# Patient Record
Sex: Male | Born: 1950 | Race: White | Hispanic: No | Marital: Married | Smoking: Never smoker
Health system: Southern US, Community
[De-identification: ages and names within clinical notes are randomized; demographics above are authoritative.]

## PROBLEM LIST (undated history)

## (undated) DIAGNOSIS — R519 Headache, unspecified: Secondary | ICD-10-CM

## (undated) DIAGNOSIS — R51 Headache: Secondary | ICD-10-CM

## (undated) DIAGNOSIS — G4730 Sleep apnea, unspecified: Secondary | ICD-10-CM

## (undated) DIAGNOSIS — E781 Pure hyperglyceridemia: Secondary | ICD-10-CM

## (undated) DIAGNOSIS — Z87442 Personal history of urinary calculi: Secondary | ICD-10-CM

## (undated) DIAGNOSIS — M1990 Unspecified osteoarthritis, unspecified site: Secondary | ICD-10-CM

## (undated) HISTORY — PX: JOINT REPLACEMENT: SHX530

## (undated) HISTORY — PX: HERNIA REPAIR: SHX51

## (undated) HISTORY — PX: OTHER SURGICAL HISTORY: SHX169

---

## 2017-11-28 ENCOUNTER — Ambulatory Visit: Payer: Self-pay | Admitting: Orthopedic Surgery

## 2017-11-28 NOTE — H&P (Signed)
Dalton Reynolds DOB: 09-04-1951 Married / Language: English / Race: White Male Date of Admission:  12/17/2017 CC:  Right hip pain History of Present Illness The patient is a 66 year old male who comes in for a preoperative History and Physical. The patient is scheduled for a right total hip arthroplasty (anterior) to be performed by Dr. [name], MD at [facility] on 12-17-2017. The patient is a 66 year old male who presented for follow up of their hip. The patient is being followed for their right hip pain and osteoarthritis. They are months out from intra-articular injection. Symptoms reported include: pain, pain with weightbearing and difficulty ambulating. The patient feels that they are doing poorly and report their pain level to be moderate to severe. The following medication has been used for pain control: none. He stated the last injection worked great for about two weeks, but he had severe pain and difficulty bearing weight after the shot wore off. He feels that the RIGHT hip has gotten a lot worse over time. The injections are not doing as well as they once did. He is a little better post injection but is not a tremendous improvement. The hip hurts with most activities. He is even having pain at rest. He has lost a lot of motion in the hip. He is unable to do things he desires because of the hip pain. LEFT hip is currently not bothering him. He is ready to get the hip fixed. They have been treated conservatively in the past for the above stated problem and despite conservative measures, they continue to have progressive pain and severe functional limitations and dysfunction. They have failed non-operative management including home exercise, medications, and injections. It is felt that they would benefit from undergoing total joint replacement. Risks and benefits of the procedure have been discussed with the patient and they elect to proceed with surgery. There are no active  contraindications to surgery such as ongoing infection or rapidly progressive neurological disease.   Problem List/Past Medical Primary osteoarthritis of right hip (M16.11)  Hypercholesterolemia  Sleep Apnea  BiPAP Roseca    Allergies  No Known Drug Allergies  Family History Chronic Obstructive Lung Disease  Brother, Mother. Diabetes Mellitus  Brother, Father.  Social History Children  3 Current work status  working full time Living situation  live with spouse Marital status  married Never consumed alcohol  03/27/2016: Never consumed alcohol No history of drug/alcohol rehab  Not under pain contract  Number of flights of stairs before winded  2-3 Tobacco / smoke exposure  03/27/2016: no Tobacco use  Never smoker. 03/27/2016 Post-Surgical Plans  Home With Family, Home with HHPT.  Medication History Sulfamethoxazole-Trimethoprim (Oral) Specific strength unknown - Active. Aspirin (81MG  Tablet, 1/2 Oral daily) Active. Fenofibrate (160MG  Tablet, Oral) Active. Omega 3 (Oral) Specific strength unknown - Active. Vitamin D (Oral) Specific strength unknown - Active.  Past Surgical History Arthroscopy of Knee  left Inguinal Hernia Repair  open: bilateral Colonoscopy   Review of Systems  General Not Present- Chills, Fatigue, Fever, Memory Loss, Night Sweats, Weight Gain and Weight Loss. Skin Not Present- Eczema, Hives, Itching, Lesions and Rash. HEENT Not Present- Dentures, Double Vision, Headache, Hearing Loss, Tinnitus and Visual Loss. Respiratory Not Present- Allergies, Chronic Cough, Coughing up blood, Shortness of breath at rest and Shortness of breath with exertion. Note: recent sore throat but resolved Cardiovascular Not Present- Chest Pain, Difficulty Breathing Lying Down, Murmur, Palpitations, Racing/skipping heartbeats and Swelling. Gastrointestinal Not Present-  Abdominal Pain, Bloody Stool, Constipation, Diarrhea, Difficulty Swallowing,  Heartburn, Jaundice, Loss of appetitie, Nausea and Vomiting. Male Genitourinary Not Present- Blood in Urine, Discharge, Flank Pain, Incontinence, Painful Urination, Urgency, Urinary frequency, Urinary Retention, Urinating at Night and Weak urinary stream. Musculoskeletal Present- Joint Pain. Not Present- Back Pain, Joint Swelling, Morning Stiffness, Muscle Pain, Muscle Weakness and Spasms. Neurological Not Present- Blackout spells, Difficulty with balance, Dizziness, Paralysis, Tremor and Weakness. Psychiatric Not Present- Insomnia.  Vitals Weight: 265 lb Height: 67.5in Weight was reported by patient. Height was reported by patient. Body Surface Area: 2.29 m Body Mass Index: 40.89 kg/m  Pulse: 76 (Regular)  BP: 132/88 (Sitting, Right Arm, Standard)   Physical Exam General Mental Status -Alert, cooperative and good historian. General Appearance-pleasant, Not in acute distress. Orientation-Oriented X3. Build & Nutrition-Well nourished and Well developed.  Head and Neck Head-normocephalic, atraumatic . Neck Global Assessment - supple, no bruit auscultated on the right, no bruit auscultated on the left.  Eye Pupil - Bilateral-Regular and Round. Motion - Bilateral-EOMI.  Chest and Lung Exam Auscultation Breath sounds - clear at anterior chest wall and clear at posterior chest wall. Adventitious sounds - No Adventitious sounds.  Cardiovascular Auscultation Rhythm - Regular rate and rhythm. Heart Sounds - S1 WNL and S2 WNL. Murmurs & Other Heart Sounds - Auscultation of the heart reveals - No Murmurs.  Abdomen Inspection Contour - Generalized mild distention. Palpation/Percussion Tenderness - Abdomen is non-tender to palpation. Rigidity (guarding) - Abdomen is soft. Auscultation Auscultation of the abdomen reveals - Bowel sounds normal.  Male Genitourinary Note: Not done, not pertinent to present illness   Musculoskeletal Note: Evaluation of the  left hip shows flexion to 120 rotation in 30 out 40 and abduction 40 without discomfort. There is no tenderness over the greater trochanter. There is no pain on provocative testing of the hip.. His RIGHT hip can be flexed to 100 and no internal rotation 25 of external rotation and 20 of abduction. He has a significantly antalgic gait pattern on the RIGHT. Radiographs AP pelvis and lateral of the RIGHT hip show bone-on-bone arthritis of that hip with subchondral cystic formation.  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 Warren Gallemore  IV TXA  Anesthesia Issues: None  Patient was instructed on what medications to stop prior to surgery.  Signed electronically by Alexzandrew L Perkins, III PA-C  

## 2017-11-28 NOTE — H&P (View-Only) (Signed)
Dalton Reynolds DOB: 09-04-1951 Married / Language: English / Race: White Male Date of Admission:  12/17/2017 CC:  Right hip pain History of Present Illness The patient is a 66 year old male who comes in for a preoperative History and Physical. The patient is scheduled for a right total hip arthroplasty (anterior) to be performed by Dr. [name], MD at [facility] on 12-17-2017. The patient is a 66 year old male who presented for follow up of their hip. The patient is being followed for their right hip pain and osteoarthritis. They are months out from intra-articular injection. Symptoms reported include: pain, pain with weightbearing and difficulty ambulating. The patient feels that they are doing poorly and report their pain level to be moderate to severe. The following medication has been used for pain control: none. He stated the last injection worked great for about two weeks, but he had severe pain and difficulty bearing weight after the shot wore off. He feels that the RIGHT hip has gotten a lot worse over time. The injections are not doing as well as they once did. He is a little better post injection but is not a tremendous improvement. The hip hurts with most activities. He is even having pain at rest. He has lost a lot of motion in the hip. He is unable to do things he desires because of the hip pain. LEFT hip is currently not bothering him. He is ready to get the hip fixed. They have been treated conservatively in the past for the above stated problem and despite conservative measures, they continue to have progressive pain and severe functional limitations and dysfunction. They have failed non-operative management including home exercise, medications, and injections. It is felt that they would benefit from undergoing total joint replacement. Risks and benefits of the procedure have been discussed with the patient and they elect to proceed with surgery. There are no active  contraindications to surgery such as ongoing infection or rapidly progressive neurological disease.   Problem List/Past Medical Primary osteoarthritis of right hip (M16.11)  Hypercholesterolemia  Sleep Apnea  BiPAP Roseca    Allergies  No Known Drug Allergies  Family History Chronic Obstructive Lung Disease  Brother, Mother. Diabetes Mellitus  Brother, Father.  Social History Children  3 Current work status  working full time Living situation  live with spouse Marital status  married Never consumed alcohol  03/27/2016: Never consumed alcohol No history of drug/alcohol rehab  Not under pain contract  Number of flights of stairs before winded  2-3 Tobacco / smoke exposure  03/27/2016: no Tobacco use  Never smoker. 03/27/2016 Post-Surgical Plans  Home With Family, Home with HHPT.  Medication History Sulfamethoxazole-Trimethoprim (Oral) Specific strength unknown - Active. Aspirin (81MG  Tablet, 1/2 Oral daily) Active. Fenofibrate (160MG  Tablet, Oral) Active. Omega 3 (Oral) Specific strength unknown - Active. Vitamin D (Oral) Specific strength unknown - Active.  Past Surgical History Arthroscopy of Knee  left Inguinal Hernia Repair  open: bilateral Colonoscopy   Review of Systems  General Not Present- Chills, Fatigue, Fever, Memory Loss, Night Sweats, Weight Gain and Weight Loss. Skin Not Present- Eczema, Hives, Itching, Lesions and Rash. HEENT Not Present- Dentures, Double Vision, Headache, Hearing Loss, Tinnitus and Visual Loss. Respiratory Not Present- Allergies, Chronic Cough, Coughing up blood, Shortness of breath at rest and Shortness of breath with exertion. Note: recent sore throat but resolved Cardiovascular Not Present- Chest Pain, Difficulty Breathing Lying Down, Murmur, Palpitations, Racing/skipping heartbeats and Swelling. Gastrointestinal Not Present-  Abdominal Pain, Bloody Stool, Constipation, Diarrhea, Difficulty Swallowing,  Heartburn, Jaundice, Loss of appetitie, Nausea and Vomiting. Male Genitourinary Not Present- Blood in Urine, Discharge, Flank Pain, Incontinence, Painful Urination, Urgency, Urinary frequency, Urinary Retention, Urinating at Night and Weak urinary stream. Musculoskeletal Present- Joint Pain. Not Present- Back Pain, Joint Swelling, Morning Stiffness, Muscle Pain, Muscle Weakness and Spasms. Neurological Not Present- Blackout spells, Difficulty with balance, Dizziness, Paralysis, Tremor and Weakness. Psychiatric Not Present- Insomnia.  Vitals Weight: 265 lb Height: 67.5in Weight was reported by patient. Height was reported by patient. Body Surface Area: 2.29 m Body Mass Index: 40.89 kg/m  Pulse: 76 (Regular)  BP: 132/88 (Sitting, Right Arm, Standard)   Physical Exam General Mental Status -Alert, cooperative and good historian. General Appearance-pleasant, Not in acute distress. Orientation-Oriented X3. Build & Nutrition-Well nourished and Well developed.  Head and Neck Head-normocephalic, atraumatic . Neck Global Assessment - supple, no bruit auscultated on the right, no bruit auscultated on the left.  Eye Pupil - Bilateral-Regular and Round. Motion - Bilateral-EOMI.  Chest and Lung Exam Auscultation Breath sounds - clear at anterior chest wall and clear at posterior chest wall. Adventitious sounds - No Adventitious sounds.  Cardiovascular Auscultation Rhythm - Regular rate and rhythm. Heart Sounds - S1 WNL and S2 WNL. Murmurs & Other Heart Sounds - Auscultation of the heart reveals - No Murmurs.  Abdomen Inspection Contour - Generalized mild distention. Palpation/Percussion Tenderness - Abdomen is non-tender to palpation. Rigidity (guarding) - Abdomen is soft. Auscultation Auscultation of the abdomen reveals - Bowel sounds normal.  Male Genitourinary Note: Not done, not pertinent to present illness   Musculoskeletal Note: Evaluation of the  left hip shows flexion to 120 rotation in 30 out 40 and abduction 40 without discomfort. There is no tenderness over the greater trochanter. There is no pain on provocative testing of the hip.Marland Kitchen. His RIGHT hip can be flexed to 100 and no internal rotation 25 of external rotation and 20 of abduction. He has a significantly antalgic gait pattern on the RIGHT. Radiographs AP pelvis and lateral of the RIGHT hip show bone-on-bone arthritis of that hip with subchondral cystic formation.  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 Brooke BonitoWarren Gallemore  IV TXA  Anesthesia Issues: None  Patient was instructed on what medications to stop prior to surgery.  Signed electronically by Lauraine RinneAlexzandrew L Latiya Navia, III PA-C

## 2017-12-11 NOTE — Patient Instructions (Signed)
Dorothe PeaDennis Winokur  12/11/2017   Your procedure is scheduled on: 12-17-17  [facility]   Report to admitting at     0745 AM   Call this number if you have problems the morning of surgery  [phone]   Remember: ONLY 1 PERSON MAY GO WITH YOU TO SHORT STAY TO GET  READY MORNING OF YOUR SURGERY.  Do not eat food or drink liquids :After Midnight.     Take these medicines the morning of surgery with A SIP OF WATER:  DO NOT TAKE ANY DIABETIC MEDICATIONS DAY OF YOUR SURGERY                               You may not have any metal on your body including hair pins and              piercings  Do not wear jewelry,  lotions, powders or perfumes, deodorant                       Men may shave face and neck.   Do not bring valuables to the hospital. [facility] IS NOT             RESPONSIBLE   FOR VALUABLES.  Contacts, dentures or bridgework may not be worn into surgery.  Leave suitcase in the car. After surgery it may be brought to your room.                Please read over the following fact sheets you were given: _____________________________________________________________________          [facility] - Preparing for Surgery Before surgery, you can play an important role.  Because skin is not sterile, your skin needs to be as free of germs as possible.  You can reduce the number of germs on your skin by washing with CHG (chlorahexidine gluconate) soap before surgery.  CHG is an antiseptic cleaner which kills germs and bonds with the skin to continue killing germs even after washing. Please DO NOT use if you have an allergy to CHG or antibacterial soaps.  If your skin becomes reddened/irritated stop using the CHG and inform your nurse when you arrive at Short Stay. Do not shave (including legs and underarms) for at least 48 hours prior to the first CHG shower.  You may shave your face/neck. Please follow these instructions carefully:  1.  Shower  with CHG Soap the night before surgery and the  morning of Surgery.  2.  If you choose to wash your hair, wash your hair first as usual with your  normal  shampoo.  3.  After you shampoo, rinse your hair and body thoroughly to remove the  shampoo.                           4.  Use CHG as you would any other liquid soap.  You can apply chg directly  to the skin and wash                       Gently with a scrungie or clean washcloth.  5.  Apply the CHG Soap to your body ONLY FROM THE NECK DOWN.   Do not use on face/  open                           Wound or open sores. Avoid contact with eyes, ears mouth and genitals (private parts).                       Wash face,  Genitals (private parts) with your normal soap.             6.  Wash thoroughly, paying special attention to the area where your surgery  will be performed.  7.  Thoroughly rinse your body with warm water from the neck down.  8.  DO NOT shower/wash with your normal soap after using and rinsing off  the CHG Soap.                9.  Pat yourself dry with a clean towel.            10.  Wear clean pajamas.            11.  Place clean sheets on your bed the night of your first shower and do not  sleep with pets. Day of Surgery : Do not apply any lotions/deodorants the morning of surgery.  Please wear clean clothes to the hospital/surgery center.  FAILURE TO FOLLOW THESE INSTRUCTIONS MAY RESULT IN THE CANCELLATION OF YOUR SURGERY PATIENT SIGNATURE_________________________________  NURSE SIGNATURE__________________________________  ________________________________________________________________________  WHAT IS A BLOOD TRANSFUSION? Blood Transfusion Information  A transfusion is the replacement of blood or some of its parts. Blood is made up of multiple cells which provide different functions.  Red blood cells carry oxygen and are used for blood loss replacement.  White blood cells fight against infection.  Platelets control  bleeding.  Plasma helps clot blood.  Other blood products are available for specialized needs, such as hemophilia or other clotting disorders. BEFORE THE TRANSFUSION  Who gives blood for transfusions?   Healthy volunteers who are fully evaluated to make sure their blood is safe. This is blood bank blood. Transfusion therapy is the safest it has ever been in the practice of medicine. Before blood is taken from a donor, a complete history is taken to make sure that person has no history of diseases nor engages in risky social behavior (examples are intravenous drug use or sexual activity with multiple partners). The donor's travel history is screened to minimize risk of transmitting infections, such as malaria. The donated blood is tested for signs of infectious diseases, such as HIV and hepatitis. The blood is then tested to be sure it is compatible with you in order to minimize the chance of a transfusion reaction. If you or a relative donates blood, this is often done in anticipation of surgery and is not appropriate for emergency situations. It takes many days to process the donated blood. RISKS AND COMPLICATIONS Although transfusion therapy is very safe and saves many lives, the main dangers of transfusion include:   Getting an infectious disease.  Developing a transfusion reaction. This is an allergic reaction to something in the blood you were given. Every precaution is taken to prevent this. The decision to have a blood transfusion has been considered carefully by your caregiver before blood is given. Blood is not given unless the benefits outweigh the risks. AFTER THE TRANSFUSION  Right after receiving a blood transfusion, you will usually feel much better and more energetic. This is especially true if your red blood  cells have gotten low (anemic). The transfusion raises the level of the red blood cells which carry oxygen, and this usually causes an energy increase.  The nurse  administering the transfusion will monitor you carefully for complications. HOME CARE INSTRUCTIONS  No special instructions are needed after a transfusion. You may find your energy is better. Speak with your caregiver about any limitations on activity for underlying diseases you may have. SEEK MEDICAL CARE IF:   Your condition is not improving after your transfusion.  You develop redness or irritation at the intravenous (IV) site. SEEK IMMEDIATE MEDICAL CARE IF:  Any of the following symptoms occur over the next 12 hours:  Shaking chills.  You have a temperature by mouth above 102 F (38.9 C), not controlled by medicine.  Chest, back, or muscle pain.  People around you feel you are not acting correctly or are confused.  Shortness of breath or difficulty breathing.  Dizziness and fainting.  You get a rash or develop hives.  You have a decrease in urine output.  Your urine turns a dark color or changes to pink, red, or brown. Any of the following symptoms occur over the next 10 days:  You have a temperature by mouth above 102 F (38.9 C), not controlled by medicine.  Shortness of breath.  Weakness after normal activity.  The white part of the eye turns yellow (jaundice).  You have a decrease in the amount of urine or are urinating less often.  Your urine turns a dark color or changes to pink, red, or brown. Document Released: 12/13/2000 Document Revised: 03/09/2012 Document Reviewed: 08/01/2008 ExitCare Patient Information 2014 [place].  _______________________________________________________________________  Incentive Spirometer  An incentive spirometer is a tool that can help keep your lungs clear and active. This tool measures how well you are filling your lungs with each breath. Taking long deep breaths may help reverse or decrease the chance of developing breathing (pulmonary) problems (especially infection) following:  A long period of time when you are  unable to move or be active. BEFORE THE PROCEDURE   If the spirometer includes an indicator to show your best effort, your nurse or respiratory therapist will set it to a desired goal.  If possible, sit up straight or lean slightly forward. Try not to slouch.  Hold the incentive spirometer in an upright position. INSTRUCTIONS FOR USE  1. Sit on the edge of your bed if possible, or sit up as far as you can in bed or on a chair. 2. Hold the incentive spirometer in an upright position. 3. Breathe out normally. 4. Place the mouthpiece in your mouth and seal your lips tightly around it. 5. Breathe in slowly and as deeply as possible, raising the piston or the ball toward the top of the column. 6. Hold your breath for 3-5 seconds or for as long as possible. Allow the piston or ball to fall to the bottom of the column. 7. Remove the mouthpiece from your mouth and breathe out normally. 8. Rest for a few seconds and repeat Steps 1 through 7 at least 10 times every 1-2 hours when you are awake. Take your time and take a few normal breaths between deep breaths. 9. The spirometer may include an indicator to show your best effort. Use the indicator as a goal to work toward during each repetition. 10. After each set of 10 deep breaths, practice coughing to be sure your lungs are clear. If you have an incision (the cut made  at the time of surgery), support your incision when coughing by placing a pillow or rolled up towels firmly against it. Once you are able to get out of bed, walk around indoors and cough well. You may stop using the incentive spirometer when instructed by your caregiver.  RISKS AND COMPLICATIONS  Take your time so you do not get dizzy or light-headed.  If you are in pain, you may need to take or ask for pain medication before doing incentive spirometry. It is harder to take a deep breath if you are having pain. AFTER USE  Rest and breathe slowly and easily.  It can be helpful to  keep track of a log of your progress. Your caregiver can provide you with a simple table to help with this. If you are using the spirometer at home, follow these instructions: [place] IF:   You are having difficultly using the spirometer.  You have trouble using the spirometer as often as instructed.  Your pain medication is not giving enough relief while using the spirometer.  You develop fever of 100.5 F (38.1 C) or higher. SEEK IMMEDIATE MEDICAL CARE IF:   You cough up bloody sputum that had not been present before.  You develop fever of 102 F (38.9 C) or greater.  You develop worsening pain at or near the incision site. MAKE SURE YOU:   Understand these instructions.  Will watch your condition.  Will get help right away if you are not doing well or get worse. Document Released: 04/28/2007 Document Revised: 03/09/2012 Document Reviewed: 06/29/2007 [facility] Patient Information 2014 [place], [place].   ________________________________________________________________________

## 2017-12-12 ENCOUNTER — Encounter (HOSPITAL_COMMUNITY): Payer: Self-pay

## 2017-12-12 ENCOUNTER — Other Ambulatory Visit: Payer: Self-pay

## 2017-12-12 ENCOUNTER — Encounter (HOSPITAL_COMMUNITY)
Admission: RE | Admit: 2017-12-12 | Discharge: 2017-12-12 | Disposition: A | Discharge status: Home / Self Care | Payer: Medicare HMO | Source: Ambulatory Visit | Attending: Orthopedic Surgery | Admitting: Orthopedic Surgery

## 2017-12-12 DIAGNOSIS — Z01812 Encounter for preprocedural laboratory examination: Secondary | ICD-10-CM | POA: Insufficient documentation

## 2017-12-12 DIAGNOSIS — M169 Osteoarthritis of hip, unspecified: Secondary | ICD-10-CM | POA: Diagnosis not present

## 2017-12-12 HISTORY — DX: Headache, unspecified: R51.9

## 2017-12-12 HISTORY — DX: Headache: R51

## 2017-12-12 HISTORY — DX: Sleep apnea, unspecified: G47.30

## 2017-12-12 HISTORY — DX: Pure hyperglyceridemia: E78.1

## 2017-12-12 HISTORY — DX: Unspecified osteoarthritis, unspecified site: M19.90

## 2017-12-12 HISTORY — DX: Personal history of urinary calculi: Z87.442

## 2017-12-12 LAB — COMPREHENSIVE METABOLIC PANEL
ALBUMIN: 4.2 g/dL (ref 3.5–5.0)
ALK PHOS: 53 U/L (ref 38–126)
ALT: 21 U/L (ref 17–63)
ANION GAP: 10 (ref 5–15)
AST: 23 U/L (ref 15–41)
BILIRUBIN TOTAL: 0.8 mg/dL (ref 0.3–1.2)
BUN: 15 mg/dL (ref 6–20)
CALCIUM: 9.5 mg/dL (ref 8.9–10.3)
CO2: 23 mmol/L (ref 22–32)
Chloride: 108 mmol/L (ref 101–111)
Creatinine, Ser: 0.98 mg/dL (ref 0.61–1.24)
GFR calc Af Amer: >60 mL/min (ref >60)
GFR calc non Af Amer: >60 mL/min (ref >60)
GLUCOSE: 84 mg/dL (ref 65–99)
Potassium: 4 mmol/L (ref 3.5–5.1)
Sodium: 141 mmol/L (ref 135–145)
TOTAL PROTEIN: 7.4 g/dL (ref 6.5–8.1)

## 2017-12-12 LAB — SURGICAL PCR SCREEN
MRSA, PCR: NEGATIVE
STAPHYLOCOCCUS AUREUS: NEGATIVE

## 2017-12-12 LAB — CBC
HCT: 43.1 % (ref 39.0–52.0)
Hemoglobin: 15 g/dL (ref 13.0–17.0)
MCH: 30.2 pg (ref 26.0–34.0)
MCHC: 34.8 g/dL (ref 30.0–36.0)
MCV: 86.9 fL (ref 78.0–100.0)
Platelets: 231 10*3/uL (ref 150–400)
RBC: 4.96 MIL/uL (ref 4.22–5.81)
RDW: 13.7 % (ref 11.5–15.5)
WBC: 4.9 10*3/uL (ref 4.0–10.5)

## 2017-12-12 LAB — PROTIME-INR
INR: 1.06
Prothrombin Time: 13.7 seconds (ref 11.4–15.2)

## 2017-12-12 LAB — APTT: APTT: 27 s (ref 24–36)

## 2017-12-12 LAB — ABO/RH: ABO/RH(D): A POS

## 2017-12-12 NOTE — Progress Notes (Signed)
OVN 12-01-17 Dr. Brynda RimGallemore under encounters

## 2017-12-16 NOTE — Anesthesia Preprocedure Evaluation (Signed)
Anesthesia Evaluation  Patient identified by MRN, date of birth, ID band Patient awake    Reviewed: Allergy & Precautions, NPO status , Patient's Chart, lab work & pertinent test results  Airway Mallampati: II  TM Distance: >3 FB Neck ROM: Full    Dental no notable dental hx.    Pulmonary neg pulmonary ROS, sleep apnea and Continuous Positive Airway Pressure Ventilation ,    Pulmonary exam normal breath sounds clear to auscultation       Cardiovascular negative cardio ROS Normal cardiovascular exam Rhythm:Regular Rate:Normal     Neuro/Psych  Headaches, negative neurological ROS  negative psych ROS   GI/Hepatic negative GI ROS, Neg liver ROS,   Endo/Other  negative endocrine ROSMorbid obesity  Renal/GU negative Renal ROS  negative genitourinary   Musculoskeletal negative musculoskeletal ROS (+) Arthritis , Osteoarthritis,    Abdominal   Peds negative pediatric ROS (+)  Hematology negative hematology ROS (+)   Anesthesia Other Findings   Reproductive/Obstetrics negative OB ROS                             Anesthesia Physical Anesthesia Plan  ASA: II  Anesthesia Plan: Spinal   Post-op Pain Management:    Induction:   PONV Risk Score and Plan: 1 and Treatment may vary due to age or medical condition  Airway Management Planned: Nasal Cannula and Natural Airway  Additional Equipment:   Intra-op Plan:   Post-operative Plan:   Informed Consent: I have reviewed the patients History and Physical, chart, labs and discussed the procedure including the risks, benefits and alternatives for the proposed anesthesia with the patient or authorized representative who has indicated his/her understanding and acceptance.     Plan Discussed with: CRNA and Anesthesiologist  Anesthesia Plan Comments: (  )        Anesthesia Quick Evaluation

## 2017-12-17 ENCOUNTER — Encounter (HOSPITAL_COMMUNITY): Payer: Self-pay | Admitting: Anesthesiology

## 2017-12-17 ENCOUNTER — Inpatient Hospital Stay (HOSPITAL_COMMUNITY): Payer: Medicare HMO | Admitting: Anesthesiology

## 2017-12-17 ENCOUNTER — Other Ambulatory Visit: Payer: Self-pay

## 2017-12-17 ENCOUNTER — Inpatient Hospital Stay (HOSPITAL_COMMUNITY): Payer: Medicare HMO

## 2017-12-17 ENCOUNTER — Encounter (HOSPITAL_COMMUNITY)
Admission: RE | Disposition: A | Discharge status: Home / Self Care | Payer: Self-pay | Source: Ambulatory Visit | Attending: Orthopedic Surgery

## 2017-12-17 ENCOUNTER — Inpatient Hospital Stay (HOSPITAL_COMMUNITY)
Admission: RE | Admit: 2017-12-17 | Discharge: 2017-12-18 | DRG: 470 | Disposition: A | Discharge status: Home / Self Care | Payer: Medicare HMO | Source: Ambulatory Visit | Attending: Orthopedic Surgery | Admitting: Orthopedic Surgery

## 2017-12-17 DIAGNOSIS — Z96649 Presence of unspecified artificial hip joint: Secondary | ICD-10-CM

## 2017-12-17 DIAGNOSIS — M1611 Unilateral primary osteoarthritis, right hip: Principal | ICD-10-CM | POA: Diagnosis present

## 2017-12-17 DIAGNOSIS — Z6841 Body Mass Index (BMI) 40.0 and over, adult: Secondary | ICD-10-CM

## 2017-12-17 DIAGNOSIS — E7800 Pure hypercholesterolemia, unspecified: Secondary | ICD-10-CM | POA: Diagnosis not present

## 2017-12-17 DIAGNOSIS — Z79899 Other long term (current) drug therapy: Secondary | ICD-10-CM

## 2017-12-17 DIAGNOSIS — Z7982 Long term (current) use of aspirin: Secondary | ICD-10-CM

## 2017-12-17 DIAGNOSIS — E781 Pure hyperglyceridemia: Secondary | ICD-10-CM | POA: Diagnosis not present

## 2017-12-17 DIAGNOSIS — Z419 Encounter for procedure for purposes other than remedying health state, unspecified: Secondary | ICD-10-CM

## 2017-12-17 DIAGNOSIS — M169 Osteoarthritis of hip, unspecified: Secondary | ICD-10-CM | POA: Diagnosis present

## 2017-12-17 DIAGNOSIS — G4730 Sleep apnea, unspecified: Secondary | ICD-10-CM | POA: Diagnosis present

## 2017-12-17 HISTORY — PX: TOTAL HIP ARTHROPLASTY: SHX124

## 2017-12-17 LAB — TYPE AND SCREEN
ABO/RH(D): A POS
Antibody Screen: NEGATIVE

## 2017-12-17 SURGERY — ARTHROPLASTY, HIP, TOTAL, ANTERIOR APPROACH
Anesthesia: Spinal | Site: Hip | Laterality: Right

## 2017-12-17 MED ORDER — PROPOFOL 500 MG/50ML IV EMUL
INTRAVENOUS | Status: DC | PRN
  Administered 2017-12-17: 75 ug/kg/min via INTRAVENOUS

## 2017-12-17 MED ORDER — ACETAMINOPHEN 325 MG PO TABS
325.0000 mg | ORAL_TABLET | ORAL | Status: DC | PRN
Start: 2017-12-17 — End: 2017-12-17

## 2017-12-17 MED ORDER — ACETAMINOPHEN 160 MG/5ML PO SOLN: 325.0000 mg | ORAL | Status: DC | PRN

## 2017-12-17 MED ORDER — CHLORHEXIDINE GLUCONATE 4 % EX LIQD: 60.0000 mL | Freq: Once | CUTANEOUS | Status: DC

## 2017-12-17 MED ORDER — ONDANSETRON HCL 4 MG/2ML IJ SOLN: 4.0000 mg | Freq: Four times a day (QID) | INTRAMUSCULAR | Status: DC | PRN

## 2017-12-17 MED ORDER — ONDANSETRON HCL 4 MG/2ML IJ SOLN
INTRAMUSCULAR | Status: AC
  Filled 2017-12-17: qty 2

## 2017-12-17 MED ORDER — DOCUSATE SODIUM 100 MG PO CAPS
100.0000 mg | ORAL_CAPSULE | Freq: Two times a day (BID) | ORAL | Status: DC
  Administered 2017-12-17 – 2017-12-18 (×2): 100 mg via ORAL
  Filled 2017-12-17 (×2): qty 1

## 2017-12-17 MED ORDER — FLEET ENEMA 7-19 GM/118ML RE ENEM: 1.0000 | ENEMA | Freq: Once | RECTAL | Status: DC | PRN

## 2017-12-17 MED ORDER — MIDAZOLAM HCL 2 MG/2ML IJ SOLN
INTRAMUSCULAR | Status: AC
  Filled 2017-12-17: qty 2

## 2017-12-17 MED ORDER — DOXAZOSIN MESYLATE 1 MG PO TABS
1.0000 mg | ORAL_TABLET | Freq: Every day | ORAL | Status: DC
  Administered 2017-12-18: 10:00:00 1 mg via ORAL
  Filled 2017-12-17: qty 1

## 2017-12-17 MED ORDER — BUPIVACAINE IN DEXTROSE 0.75-8.25 % IT SOLN
INTRATHECAL | Status: DC | PRN
  Administered 2017-12-17: 12 mg via INTRATHECAL

## 2017-12-17 MED ORDER — ACETAMINOPHEN 500 MG PO TABS
1000.0000 mg | ORAL_TABLET | Freq: Four times a day (QID) | ORAL | Status: DC
  Administered 2017-12-17 – 2017-12-18 (×3): 1000 mg via ORAL
  Filled 2017-12-17 (×3): qty 2

## 2017-12-17 MED ORDER — BUPIVACAINE HCL (PF) 0.25 % IJ SOLN
INTRAMUSCULAR | Status: AC
  Filled 2017-12-17: qty 30

## 2017-12-17 MED ORDER — METHOCARBAMOL 1000 MG/10ML IJ SOLN
500.0000 mg | Freq: Four times a day (QID) | INTRAMUSCULAR | Status: DC | PRN
  Filled 2017-12-17: qty 5

## 2017-12-17 MED ORDER — DEXTROSE 5 % IV SOLN
3.0000 g | INTRAVENOUS | Status: AC
  Administered 2017-12-17: 3 g via INTRAVENOUS
  Filled 2017-12-17: qty 3
  Filled 2017-12-17: qty 3000

## 2017-12-17 MED ORDER — FENTANYL CITRATE (PF) 100 MCG/2ML IJ SOLN
INTRAMUSCULAR | Status: DC | PRN
  Administered 2017-12-17: 75 ug via INTRAVENOUS
  Administered 2017-12-17: 25 ug via INTRAVENOUS

## 2017-12-17 MED ORDER — PHENOL 1.4 % MT LIQD: 1.0000 | OROMUCOSAL | Status: DC | PRN

## 2017-12-17 MED ORDER — OXYCODONE HCL 5 MG PO TABS
5.0000 mg | ORAL_TABLET | ORAL | Status: DC | PRN
  Administered 2017-12-17 – 2017-12-18 (×3): 5 mg via ORAL
  Filled 2017-12-17 (×3): qty 1

## 2017-12-17 MED ORDER — EPHEDRINE SULFATE-NACL 50-0.9 MG/10ML-% IV SOSY
PREFILLED_SYRINGE | INTRAVENOUS | Status: DC | PRN
  Administered 2017-12-17 (×7): 5 mg via INTRAVENOUS

## 2017-12-17 MED ORDER — METOCLOPRAMIDE HCL 5 MG PO TABS: 5.0000 mg | ORAL_TABLET | Freq: Three times a day (TID) | ORAL | Status: DC | PRN

## 2017-12-17 MED ORDER — DEXAMETHASONE SODIUM PHOSPHATE 10 MG/ML IJ SOLN
INTRAMUSCULAR | Status: AC
  Filled 2017-12-17: qty 1

## 2017-12-17 MED ORDER — MIDAZOLAM HCL 5 MG/5ML IJ SOLN
INTRAMUSCULAR | Status: DC | PRN
  Administered 2017-12-17: 2 mg via INTRAVENOUS

## 2017-12-17 MED ORDER — DIPHENHYDRAMINE HCL 12.5 MG/5ML PO ELIX
12.5000 mg | ORAL_SOLUTION | ORAL | Status: DC | PRN
  Administered 2017-12-18: 12.5 mg via ORAL
  Filled 2017-12-17: qty 5

## 2017-12-17 MED ORDER — PROPOFOL 10 MG/ML IV BOLUS
INTRAVENOUS | Status: AC
  Filled 2017-12-17: qty 60

## 2017-12-17 MED ORDER — CEFAZOLIN SODIUM-DEXTROSE 2-4 GM/100ML-% IV SOLN
2.0000 g | Freq: Four times a day (QID) | INTRAVENOUS | Status: AC
  Administered 2017-12-17 (×2): 2 g via INTRAVENOUS
  Filled 2017-12-17 (×2): qty 100

## 2017-12-17 MED ORDER — TRAMADOL HCL 50 MG PO TABS: 50.0000 mg | ORAL_TABLET | Freq: Four times a day (QID) | ORAL | 0 refills | Status: AC | PRN

## 2017-12-17 MED ORDER — TRAMADOL HCL 50 MG PO TABS: 50.0000 mg | ORAL_TABLET | Freq: Four times a day (QID) | ORAL | Status: DC | PRN

## 2017-12-17 MED ORDER — ONDANSETRON HCL 4 MG PO TABS: 4.0000 mg | ORAL_TABLET | Freq: Four times a day (QID) | ORAL | Status: DC | PRN

## 2017-12-17 MED ORDER — DEXAMETHASONE SODIUM PHOSPHATE 10 MG/ML IJ SOLN
10.0000 mg | Freq: Once | INTRAMUSCULAR | Status: AC
  Administered 2017-12-17: 10 mg via INTRAVENOUS

## 2017-12-17 MED ORDER — METHOCARBAMOL 500 MG PO TABS
500.0000 mg | ORAL_TABLET | Freq: Four times a day (QID) | ORAL | Status: DC | PRN
  Administered 2017-12-17 – 2017-12-18 (×2): 500 mg via ORAL
  Filled 2017-12-17 (×2): qty 1

## 2017-12-17 MED ORDER — BISACODYL 10 MG RE SUPP: 10.0000 mg | Freq: Every day | RECTAL | Status: DC | PRN

## 2017-12-17 MED ORDER — PROPOFOL 10 MG/ML IV BOLUS
INTRAVENOUS | Status: AC
  Filled 2017-12-17: qty 20

## 2017-12-17 MED ORDER — ALBUMIN HUMAN 5 % IV SOLN
INTRAVENOUS | Status: DC | PRN
  Administered 2017-12-17: 11:00:00 via INTRAVENOUS

## 2017-12-17 MED ORDER — MENTHOL 3 MG MT LOZG: 1.0000 | LOZENGE | OROMUCOSAL | Status: DC | PRN

## 2017-12-17 MED ORDER — FENTANYL CITRATE (PF) 100 MCG/2ML IJ SOLN
INTRAMUSCULAR | Status: AC
  Filled 2017-12-17: qty 2

## 2017-12-17 MED ORDER — ONDANSETRON HCL 4 MG/2ML IJ SOLN
INTRAMUSCULAR | Status: DC | PRN
  Administered 2017-12-17: 4 mg via INTRAVENOUS

## 2017-12-17 MED ORDER — OXYCODONE HCL 5 MG PO TABS: 5.0000 mg | ORAL_TABLET | ORAL | 0 refills | Status: AC | PRN

## 2017-12-17 MED ORDER — BUPIVACAINE HCL (PF) 0.25 % IJ SOLN
INTRAMUSCULAR | Status: DC | PRN
  Administered 2017-12-17: 30 mL

## 2017-12-17 MED ORDER — SULFAMETHOXAZOLE-TRIMETHOPRIM 800-160 MG PO TABS
1.0000 | ORAL_TABLET | Freq: Every day | ORAL | Status: DC
  Administered 2017-12-18: 10:00:00 1 via ORAL
  Filled 2017-12-17: qty 1

## 2017-12-17 MED ORDER — OXYCODONE HCL 5 MG/5ML PO SOLN
5.0000 mg | Freq: Once | ORAL | Status: DC | PRN
  Filled 2017-12-17: qty 5

## 2017-12-17 MED ORDER — ACETAMINOPHEN 325 MG PO TABS
650.0000 mg | ORAL_TABLET | ORAL | Status: DC | PRN
Start: 2017-12-18 — End: 2017-12-18

## 2017-12-17 MED ORDER — MEPERIDINE HCL 50 MG/ML IJ SOLN
6.2500 mg | INTRAMUSCULAR | Status: DC | PRN
Start: 2017-12-17 — End: 2017-12-17

## 2017-12-17 MED ORDER — FENTANYL CITRATE (PF) 100 MCG/2ML IJ SOLN: 25.0000 ug | INTRAMUSCULAR | Status: DC | PRN

## 2017-12-17 MED ORDER — SODIUM CHLORIDE 0.9 % IR SOLN: Status: DC | PRN

## 2017-12-17 MED ORDER — METHOCARBAMOL 500 MG PO TABS: 500.0000 mg | ORAL_TABLET | Freq: Four times a day (QID) | ORAL | 0 refills | Status: AC | PRN

## 2017-12-17 MED ORDER — RIVAROXABAN 10 MG PO TABS: 10.0000 mg | ORAL_TABLET | Freq: Every day | ORAL | 0 refills | Status: AC

## 2017-12-17 MED ORDER — OXYCODONE HCL 5 MG PO TABS
10.0000 mg | ORAL_TABLET | ORAL | Status: DC | PRN
  Administered 2017-12-18 (×2): 10 mg via ORAL
  Filled 2017-12-17 (×2): qty 2

## 2017-12-17 MED ORDER — ONDANSETRON HCL 4 MG/2ML IJ SOLN
4.0000 mg | Freq: Once | INTRAMUSCULAR | Status: DC | PRN
Start: 2017-12-17 — End: 2017-12-17

## 2017-12-17 MED ORDER — OXYCODONE HCL 5 MG PO TABS: 5.0000 mg | ORAL_TABLET | Freq: Once | ORAL | Status: DC | PRN

## 2017-12-17 MED ORDER — ACETAMINOPHEN 10 MG/ML IV SOLN
INTRAVENOUS | Status: AC
  Filled 2017-12-17: qty 100

## 2017-12-17 MED ORDER — ALBUMIN HUMAN 5 % IV SOLN
INTRAVENOUS | Status: AC
  Filled 2017-12-17: qty 250

## 2017-12-17 MED ORDER — ACETAMINOPHEN 650 MG RE SUPP: 650.0000 mg | RECTAL | Status: DC | PRN

## 2017-12-17 MED ORDER — DEXAMETHASONE SODIUM PHOSPHATE 10 MG/ML IJ SOLN
10.0000 mg | Freq: Once | INTRAMUSCULAR | Status: AC
  Administered 2017-12-18: 10:00:00 10 mg via INTRAVENOUS
  Filled 2017-12-17: qty 1

## 2017-12-17 MED ORDER — METOCLOPRAMIDE HCL 5 MG/ML IJ SOLN: 5.0000 mg | Freq: Three times a day (TID) | INTRAMUSCULAR | Status: DC | PRN

## 2017-12-17 MED ORDER — KETOROLAC TROMETHAMINE 30 MG/ML IJ SOLN: 30.0000 mg | Freq: Once | INTRAMUSCULAR | Status: DC | PRN

## 2017-12-17 MED ORDER — LACTATED RINGERS IV SOLN
INTRAVENOUS | Status: DC
  Administered 2017-12-17: 1000 mL via INTRAVENOUS
  Administered 2017-12-17 (×3): via INTRAVENOUS

## 2017-12-17 MED ORDER — POLYETHYLENE GLYCOL 3350 17 G PO PACK
17.0000 g | PACK | Freq: Every day | ORAL | Status: DC | PRN
Start: 2017-12-17 — End: 2017-12-18

## 2017-12-17 MED ORDER — SODIUM CHLORIDE 0.9 % IV SOLN
INTRAVENOUS | Status: DC
  Administered 2017-12-17: 15:00:00 via INTRAVENOUS

## 2017-12-17 MED ORDER — RIVAROXABAN 10 MG PO TABS
10.0000 mg | ORAL_TABLET | Freq: Every day | ORAL | Status: DC
  Administered 2017-12-18: 10 mg via ORAL
  Filled 2017-12-17: qty 1

## 2017-12-17 MED ORDER — 0.9 % SODIUM CHLORIDE (POUR BTL) OPTIME
TOPICAL | Status: DC | PRN
  Administered 2017-12-17: 1000 mL

## 2017-12-17 MED ORDER — ACETAMINOPHEN 10 MG/ML IV SOLN
1000.0000 mg | Freq: Once | INTRAVENOUS | Status: AC
  Administered 2017-12-17: 1000 mg via INTRAVENOUS

## 2017-12-17 MED ORDER — TRANEXAMIC ACID 1000 MG/10ML IV SOLN
1000.0000 mg | INTRAVENOUS | Status: AC
  Administered 2017-12-17: 1000 mg via INTRAVENOUS
  Filled 2017-12-17: qty 1100
  Filled 2017-12-17: qty 10

## 2017-12-17 MED ORDER — MORPHINE SULFATE (PF) 4 MG/ML IV SOLN: 1.0000 mg | INTRAVENOUS | Status: DC | PRN

## 2017-12-17 MED ORDER — SODIUM CHLORIDE 0.9 % IV SOLN
1000.0000 mg | Freq: Once | INTRAVENOUS | Status: AC
  Administered 2017-12-17: 1000 mg via INTRAVENOUS
  Filled 2017-12-17: qty 1100

## 2017-12-17 SURGICAL SUPPLY — 34 items
BAG DECANTER FOR FLEXI CONT (MISCELLANEOUS) ×3 IMPLANT
BAG ZIPLOCK 12X15 (MISCELLANEOUS) IMPLANT
BLADE SAG 18X100X1.27 (BLADE) ×3 IMPLANT
CAPT HIP TOTAL 2 ×3 IMPLANT
CLOSURE WOUND 1/2 X4 (GAUZE/BANDAGES/DRESSINGS) ×1
CLOTH BEACON ORANGE TIMEOUT ST (SAFETY) ×3 IMPLANT
COVER PERINEAL POST (MISCELLANEOUS) ×3 IMPLANT
COVER SURGICAL LIGHT HANDLE (MISCELLANEOUS) ×3 IMPLANT
DECANTER SPIKE VIAL GLASS SM (MISCELLANEOUS) ×3 IMPLANT
DRAPE STERI IOBAN 125X83 (DRAPES) ×3 IMPLANT
DRAPE U-SHAPE 47X51 STRL (DRAPES) ×6 IMPLANT
DRSG ADAPTIC 3X8 NADH LF (GAUZE/BANDAGES/DRESSINGS) ×3 IMPLANT
DRSG MEPILEX BORDER 4X4 (GAUZE/BANDAGES/DRESSINGS) ×3 IMPLANT
DRSG MEPILEX BORDER 4X8 (GAUZE/BANDAGES/DRESSINGS) ×3 IMPLANT
DURAPREP 26ML APPLICATOR (WOUND CARE) ×3 IMPLANT
ELECT REM PT RETURN 15FT ADLT (MISCELLANEOUS) ×3 IMPLANT
EVACUATOR 1/8 PVC DRAIN (DRAIN) ×3 IMPLANT
GLOVE BIO SURGEON STRL SZ7.5 (GLOVE) ×3 IMPLANT
GLOVE BIO SURGEON STRL SZ8 (GLOVE) ×6 IMPLANT
GLOVE BIOGEL PI IND STRL 8 (GLOVE) ×2 IMPLANT
GLOVE BIOGEL PI INDICATOR 8 (GLOVE) ×4
GOWN STRL REUS W/TWL LRG LVL3 (GOWN DISPOSABLE) ×3 IMPLANT
GOWN STRL REUS W/TWL XL LVL3 (GOWN DISPOSABLE) ×3 IMPLANT
PACK ANTERIOR HIP CUSTOM (KITS) ×3 IMPLANT
STRIP CLOSURE SKIN 1/2X4 (GAUZE/BANDAGES/DRESSINGS) ×2 IMPLANT
SUT ETHIBOND NAB CT1 #1 30IN (SUTURE) ×3 IMPLANT
SUT MNCRL AB 4-0 PS2 18 (SUTURE) ×3 IMPLANT
SUT STRATAFIX 0 PDS 27 VIOLET (SUTURE) ×6
SUT VIC AB 2-0 CT1 27 (SUTURE) ×6
SUT VIC AB 2-0 CT1 TAPERPNT 27 (SUTURE) ×3 IMPLANT
SUTURE STRATFX 0 PDS 27 VIOLET (SUTURE) ×2 IMPLANT
SYR 50ML LL SCALE MARK (SYRINGE) IMPLANT
TRAY FOLEY W/METER SILVER 16FR (SET/KITS/TRAYS/PACK) ×3 IMPLANT
YANKAUER SUCT BULB TIP 10FT TU (MISCELLANEOUS) ×3 IMPLANT

## 2017-12-17 NOTE — Transfer of Care (Signed)
Immediate Anesthesia Transfer of Care Note  Patient: Dalton Reynolds  Procedure(s) Performed: RIGHT  TOTAL HIP ARTHROPLASTY ANTERIOR APPROACH (Right Hip)  Patient Location: PACU  Anesthesia Type:MAC and Spinal  Level of Consciousness: awake, alert , oriented and patient cooperative  Airway & Oxygen Therapy: Patient Spontanous Breathing and Patient connected to face mask oxygen  Post-op Assessment: Report given to RN, Post -op Vital signs reviewed and stable and Patient moving all extremities  Post vital signs: Reviewed and stable  Last Vitals:  Vitals:   12/17/17 0751  BP: 126/75  Pulse: 74  Resp: 16  Temp: 36.8 C  SpO2: 98%    Last Pain:  Vitals:   12/17/17 0751  TempSrc: Oral      Patients Stated Pain Goal: 4 (13/[birth year] 7846)  Complications: No apparent anesthesia complications

## 2017-12-17 NOTE — Discharge Instructions (Addendum)
° °Dr. Frank Aluisio °Total Joint Specialist °[facility] °[address] °[place], [place] [address] °[phone] ° °ANTERIOR APPROACH TOTAL HIP REPLACEMENT POSTOPERATIVE DIRECTIONS ° ° °Hip Rehabilitation, Guidelines Following Surgery  °The results of a hip operation are greatly improved after range of motion and muscle strengthening exercises. Follow all safety measures which are given to protect your hip. If any of these exercises cause increased pain or swelling in your joint, decrease the amount until you are comfortable again. Then slowly increase the exercises. Call your caregiver if you have problems or questions.  ° °HOME CARE INSTRUCTIONS  °Remove items at home which could result in a fall. This includes throw rugs or furniture in walking pathways.  °· ICE to the affected hip every three hours for 30 minutes at a time and then as needed for pain and swelling.  Continue to use ice on the hip for pain and swelling from surgery. You may notice swelling that will progress down to the foot and ankle.  This is normal after surgery.  Elevate the leg when you are not up walking on it.   °· Continue to use the breathing machine which will help keep your temperature down.  It is common for your temperature to cycle up and down following surgery, especially at night when you are not up moving around and exerting yourself.  The breathing machine keeps your lungs expanded and your temperature down. ° ° °DIET °You may resume your previous home diet once your are discharged from the hospital. ° °DRESSING / WOUND CARE / SHOWERING °You may shower 3 days after surgery, but keep the wounds dry during showering.  You may use an occlusive plastic wrap (Press'n Seal for example), NO SOAKING/SUBMERGING IN THE BATHTUB.  If the bandage gets wet, change with a clean dry gauze.  If the incision gets wet, pat the wound dry with a clean towel. °You may start showering once you are discharged home but do not  submerge the incision under water. Just pat the incision dry and apply a dry gauze dressing on daily. °Change the surgical dressing daily and reapply a dry dressing each time. ° °ACTIVITY °Walk with your walker as instructed. °Use walker as long as suggested by your caregivers. °Avoid periods of inactivity such as sitting longer than an hour when not asleep. This helps prevent blood clots.  °You may resume a sexual relationship in one month or when given the OK by your doctor.  °You may return to work once you are cleared by your doctor.  °Do not drive a car for 6 weeks or until released by you surgeon.  °Do not drive while taking narcotics. ° °WEIGHT BEARING °Weight bearing as tolerated with assist device (walker, cane, etc) as directed, use it as long as suggested by your surgeon or therapist, typically at least 4-6 weeks. ° °POSTOPERATIVE CONSTIPATION PROTOCOL °Constipation - defined medically as fewer than three stools per week and severe constipation as less than one stool per week. ° °One of the most common issues patients have following surgery is constipation.  Even if you have a regular bowel pattern at home, your normal regimen is likely to be disrupted due to multiple reasons following surgery.  Combination of anesthesia, postoperative narcotics, change in appetite and fluid intake all can affect your bowels.  In order to avoid complications following surgery, here are some recommendations in order to help you during your recovery period. ° °Colace (docusate) - Pick up an over-the-counter   form of Colace or another stool softener and take twice a day as long as you are requiring postoperative pain medications.  Take with a full glass of water daily.  If you experience loose stools or diarrhea, hold the colace until you stool forms back up.  If your symptoms do not get better within 1 week or if they get worse, check with your doctor. ° °Dulcolax (bisacodyl) - Pick up over-the-counter and take as directed  by the product packaging as needed to assist with the movement of your bowels.  Take with a full glass of water.  Use this product as needed if not relieved by Colace only.  ° °MiraLax (polyethylene glycol) - Pick up over-the-counter to have on hand.  MiraLax is a solution that will increase the amount of water in your bowels to assist with bowel movements.  Take as directed and can mix with a glass of water, juice, soda, coffee, or tea.  Take if you go more than two days without a movement. °Do not use MiraLax more than once per day. Call your doctor if you are still constipated or irregular after using this medication for 7 days in a row. ° °If you continue to have problems with postoperative constipation, please contact the office for further assistance and recommendations.  If you experience "the worst abdominal pain ever" or develop nausea or vomiting, please contact the office immediatly for further recommendations for treatment. ° °ITCHING ° If you experience itching with your medications, try taking only a single pain pill, or even half a pain pill at a time.  You can also use Benadryl over the counter for itching or also to help with sleep.  ° °TED HOSE STOCKINGS °Wear the elastic stockings on both legs for three weeks following surgery during the day but you may remove then at night for sleeping. ° °MEDICATIONS °See your medication summary on the “After Visit Summary” that the nursing staff will review with you prior to discharge.  You may have some home medications which will be placed on hold until you complete the course of blood thinner medication.  It is important for you to complete the blood thinner medication as prescribed by your surgeon.  Continue your approved medications as instructed at time of discharge. ° °PRECAUTIONS °If you experience chest pain or shortness of breath - call 911 immediately for transfer to the hospital emergency department.  °If you develop a fever greater that 101 F,  purulent drainage from wound, increased redness or drainage from wound, foul odor from the wound/dressing, or calf pain - CONTACT YOUR SURGEON.   °                                                °FOLLOW-UP APPOINTMENTS °Make sure you keep all of your appointments after your operation with your surgeon and caregivers. You should call the office at the above phone number and make an appointment for approximately two weeks after the date of your surgery or on the date instructed by your surgeon outlined in the "After Visit Summary". ° °RANGE OF MOTION AND STRENGTHENING EXERCISES  °These exercises are designed to help you keep full movement of your hip joint. Follow your caregiver's or physical therapist's instructions. Perform all exercises about fifteen times, three times per day or as directed. Exercise both hips, even if you   have had only one joint replacement. These exercises can be done on a training (exercise) mat, on the floor, on a table or on a bed. Use whatever works the best and is most comfortable for you. Use music or television while you are exercising so that the exercises are a pleasant break in your day. This will make your life better with the exercises acting as a break in routine you can look forward to.  °Lying on your back, slowly slide your foot toward your buttocks, raising your knee up off the floor. Then slowly slide your foot back down until your leg is straight again.  °Lying on your back spread your legs as far apart as you can without causing discomfort.  °Lying on your side, raise your upper leg and foot straight up from the floor as far as is comfortable. Slowly lower the leg and repeat.  °Lying on your back, tighten up the muscle in the front of your thigh (quadriceps muscles). You can do this by keeping your leg straight and trying to raise your heel off the floor. This helps strengthen the largest muscle supporting your knee.  °Lying on your back, tighten up the muscles of your  buttocks both with the legs straight and with the knee bent at a comfortable angle while keeping your heel on the floor.  ° °IF YOU ARE TRANSFERRED TO A SKILLED REHAB FACILITY °If the patient is transferred to a skilled rehab facility following release from the hospital, a list of the current medications will be sent to the facility for the patient to continue.  When discharged from the skilled rehab facility, please have the facility set up the patient's Home Health Physical Therapy prior to being released. Also, the skilled facility will be responsible for providing the patient with their medications at time of release from the facility to include their pain medication, the muscle relaxants, and their blood thinner medication. If the patient is still at the rehab facility at time of the two week follow up appointment, the skilled rehab facility will also need to assist the patient in arranging follow up appointment in our office and any transportation needs. ° °MAKE SURE YOU:  °Understand these instructions.  °Get help right away if you are not doing well or get worse.  ° ° °Pick up stool softner and laxative for home use following surgery while on pain medications. °Do not submerge incision under water. °Please use good hand washing techniques while changing dressing each day. °May shower starting three days after surgery. °Please use a clean towel to pat the incision dry following showers. °Continue to use ice for pain and swelling after surgery. °Do not use any lotions or creams on the incision until instructed by your surgeon. ° °Take Xarelto for two and a half more weeks following discharge from the hospital, then discontinue Xarelto. °Once the patient has completed the Xarelto, they may resume the 81 mg Aspirin. ° ° ° °Information on my medicine - XARELTO® (Rivaroxaban) ° °This medication education was reviewed with me or my healthcare representative as part of my discharge preparation.  The pharmacist that  spoke with me during my hospital stay was:    °Why was Xarelto® prescribed for you? °Xarelto® was prescribed for you to reduce the risk of blood clots forming after orthopedic surgery. The medical term for these abnormal blood clots is venous thromboembolism (VTE). ° °What do you need to know about xarelto® ? °Take your Xarelto® ONCE DAILY at the   same time every day. °You may take it either with or without food. ° °If you have difficulty swallowing the tablet whole, you may crush it and mix in applesauce just prior to taking your dose. ° °Take Xarelto® exactly as prescribed by your doctor and DO NOT stop taking Xarelto® without talking to the doctor who prescribed the medication.  Stopping without other VTE prevention medication to take the place of Xarelto® may increase your risk of developing a clot. ° °After discharge, you should have regular check-up appointments with your healthcare provider that is prescribing your Xarelto®.   ° °What do you do if you miss a dose? °If you miss a dose, take it as soon as you remember on the same day then continue your regularly scheduled once daily regimen the next day. Do not take two doses of Xarelto® on the same day.  ° °Important Safety Information °A possible side effect of Xarelto® is bleeding. You should call your healthcare provider right away if you experience any of the following: °? Bleeding from an injury or your nose that does not stop. °? Unusual colored urine (red or dark brown) or unusual colored stools (red or black). °? Unusual bruising for unknown reasons. °? A serious fall or if you hit your head (even if there is no bleeding). ° °Some medicines may interact with Xarelto® and might increase your risk of bleeding while on Xarelto®. To help avoid this, consult your healthcare provider or pharmacist prior to using any new prescription or non-prescription medications, including herbals, vitamins, non-steroidal anti-inflammatory drugs (NSAIDs) and  supplements. ° °This website has more information on Xarelto®: www.xarelto.com. ° ° °

## 2017-12-17 NOTE — Interval H&P Note (Signed)
History and Physical Interval Note:  12/17/2017 8:10 AM  Dalton Reynolds  has presented today for surgery, with the diagnosis of Right hip osteoarthritis   The various methods of treatment have been discussed with the patient and family. After consideration of risks, benefits and other options for treatment, the patient has consented to  Procedure(s): RIGHT  TOTAL HIP ARTHROPLASTY ANTERIOR APPROACH (Right) as a surgical intervention .  The patient's history has been reviewed, patient examined, no change in status, stable for surgery.  I have reviewed the patient's chart and labs.  Questions were answered to the patient's satisfaction.     Homero FellersFrank Unika Nazareno

## 2017-12-17 NOTE — Op Note (Signed)
OPERATIVE REPORT- TOTAL HIP ARTHROPLASTY   PREOPERATIVE DIAGNOSIS: Osteoarthritis of the Right hip.   POSTOPERATIVE DIAGNOSIS: Osteoarthritis of the Right  hip.   PROCEDURE: Right total hip arthroplasty, anterior approach.   SURGEON: Dalton GrossFrank Tobi Leinweber, MD   ASSISTANT: Dalton Peacerew Perkins, PA-C  ANESTHESIA:  Spinal  ESTIMATED BLOOD LOSS:-400 mL    DRAINS: Hemovac x1.   COMPLICATIONS: None   CONDITION: PACU - hemodynamically stable.   BRIEF CLINICAL NOTE: Dalton Reynolds is a 66 y.o. male who has advanced end-  stage arthritis of their Right  hip with progressively worsening pain and  dysfunction.The patient has failed nonoperative management and presents for  total hip arthroplasty.   PROCEDURE IN DETAIL: After successful administration of spinal  anesthetic, the traction boots [name] were [facility] on both  feet and the patient was [name], boots placed into the leg  holders. The Right hip was then isolated from the perineum with plastic  drapes and prepped and draped in the usual sterile fashion. ASIS and  greater trochanter were marked and a oblique incision was made, starting  at about 1 cm lateral and 2 cm distal to the ASIS and coursing towards  the anterior cortex of the femur. The skin was cut with a 10 blade  through subcutaneous tissue to the level of the fascia overlying the  tensor fascia lata muscle. The fascia was then incised in line with the  incision at the junction of the anterior third and posterior 2/3rd. The  muscle was teased off the fascia and then the interval between the TFL  and the rectus was developed. The Hohmann retractor was then placed at  the top of the femoral neck over the capsule. The vessels overlying the  capsule were cauterized and the fat on top of the capsule was removed.  A Hohmann retractor was then placed anterior underneath the rectus  femoris to give exposure to the entire anterior capsule. A T-shaped   capsulotomy was performed. The edges were tagged and the femoral head  was identified.       Osteophytes are removed off the superior acetabulum.  The femoral neck was then cut in situ with an oscillating saw. Traction  was then applied to the left lower extremity [facility] the [facility]  traction. The femoral head was then removed. Retractors were placed  around the acetabulum and then circumferential removal of the labrum was  performed. Osteophytes were also removed. Reaming starts at 47 mm to  medialize and  Increased in 2 mm increments to 53 mm. We reamed in  approximately 40 degrees of abduction, 20 degrees anteversion. A 54 mm  pinnacle acetabular shell was then impacted in anatomic position under  fluoroscopic guidance with excellent purchase. We did not need to place  any additional dome screws. A 36 mm neutral + 4 marathon liner was then  placed into the acetabular shell.       The femoral lift was then placed along the lateral aspect of the femur  just distal to the vastus ridge. The leg was  externally rotated and capsule  was stripped off the inferior aspect of the femoral neck down to the  level of the lesser trochanter, this was done with electrocautery. The femur was lifted after this was performed. The  leg was then placed in an extended and adducted position essentially delivering the femur. We also removed the capsule superiorly and the piriformis from the piriformis fossa  to gain excellent exposure of the  proximal femur. Rongeur was used to remove some cancellous bone to get  into the lateral portion of the proximal femur for placement of the  initial starter reamer. The starter broaches was placed  the starter broach  and was shown to go down the center of the canal. Broaching  with the  Corail system was then performed starting at size 8, coursing  Up to size 11. A size 11 had excellent torsional and rotational  and axial stability. The trial high offset neck was then  placed  with a 36 + 5 trial head. The hip was then reduced. We confirmed that  the stem was in the canal both on AP and lateral x-rays. It also has excellent sizing. The hip was reduced with outstanding stability through full extension and full external rotation.. AP pelvis was taken and the leg lengths were measured and found to be equal. Hip was then dislocated again and the femoral head and neck removed. The  femoral broach was removed. Size 11 Corail stem with a high offset  neck was then impacted into the femur following native anteversion. Has  excellent purchase in the canal. Excellent torsional and rotational and  axial stability. It is confirmed to be in the canal on AP and lateral  fluoroscopic views. The 36 + 5 ceramic head was placed and the hip  reduced with outstanding stability. Again AP pelvis was taken and it  confirmed that the leg lengths were equal. The wound was then copiously  irrigated with saline solution and the capsule reattached and repaired  with Ethibond suture. 30 ml of .25% Bupivicaine was  injected into the capsule and into the edge of the tensor fascia lata as well as subcutaneous tissue. The fascia overlying the tensor fascia lata was then closed with a running #1 V-Loc. Subcu was closed with interrupted 2-0 Vicryl and subcuticular running 4-0 Monocryl. Incision was cleaned  and dried. Steri-Strips and a bulky sterile dressing applied. Hemovac  drain was hooked to suction and then the patient was awakened and transported to  recovery in stable condition.        Please note that a surgical assistant was a medical necessity for this procedure to perform it in a safe and expeditious manner. Assistant was necessary to provide appropriate retraction of vital neurovascular structures and to prevent femoral fracture and allow for anatomic placement of the prosthesis.  Dalton GrossFrank Levonte Reynolds, M.D.

## 2017-12-17 NOTE — Anesthesia Procedure Notes (Signed)
Spinal  Patient location during procedure: OR Start time: 12/17/2017 9:45 AM End time: 12/17/2017 9:49 AM Staffing Anesthesiologist: Bethena Midgetddono, Melvern Ramone, MD Preanesthetic Checklist Completed: patient identified, site marked, surgical consent, pre-op evaluation, timeout performed, IV checked, risks and benefits discussed and monitors and equipment checked Spinal Block Patient position: sitting Prep: DuraPrep Patient monitoring: heart rate, cardiac monitor, continuous pulse ox and blood pressure Approach: midline Location: L4-5 Injection technique: single-shot Needle Needle type: Sprotte  Needle gauge: 24 G Needle length: 9 cm Assessment Sensory level: T4

## 2017-12-17 NOTE — Progress Notes (Addendum)
Spoke with pt regarding cpap use.  Pt refused stating he wears it half the time, and said he wll be fine without it tonight.  Pt was advised that RT is available all night and encouraged him to call, should he change his mind.

## 2017-12-17 NOTE — Anesthesia Postprocedure Evaluation (Signed)
Anesthesia Post Note  Patient: Dorothe PeaDennis Cody  Procedure(s) Performed: RIGHT  TOTAL HIP ARTHROPLASTY ANTERIOR APPROACH (Right Hip)     Patient location during evaluation: PACU Anesthesia Type: Spinal Level of consciousness: oriented and awake and alert Pain management: pain level controlled Vital Signs Assessment: post-procedure vital signs reviewed and stable Respiratory status: spontaneous breathing, respiratory function stable and patient connected to nasal cannula oxygen Cardiovascular status: blood pressure returned to baseline and stable Postop Assessment: no headache, no backache and no apparent nausea or vomiting Anesthetic complications: no    Last Vitals:  Vitals:   12/17/17 1215 12/17/17 1230  BP: 135/81 133/65  Pulse: 68 66  Resp: 16 15  Temp:    SpO2: 100% 100%    Last Pain:  Vitals:   12/17/17 0751  TempSrc: Oral    LLE Motor Response: Purposeful movement;Responds to commands (12/17/17 1230) LLE Sensation: Numbness (12/17/17 1230) RLE Motor Response: Purposeful movement;Responds to commands (12/17/17 1230) RLE Sensation: Numbness (12/17/17 1230) L Sensory Level: L3-Anterior knee, lower leg (12/17/17 1230) R Sensory Level: L4-Anterior knee, lower leg (12/17/17 1230)  Lavender Stanke

## 2017-12-17 NOTE — Evaluation (Signed)
Physical Therapy Evaluation Patient Details Name: Dalton Reynolds MRN: [id number] DOB: June 28, 1951 Today's Date: 12/17/2017   History of Present Illness  right Direct anterior THA  Clinical Impression  The  Patient ambulated x 144 '. Tolerated well. Encouraged to get pain medication soon. Pt admitted with above diagnosis. Pt currently with functional limitations due to the deficits listed below (see PT Problem List). Pt will benefit from skilled PT to increase their independence and safety with mobility to allow discharge to the venue listed below.       Follow Up Recommendations DC plan and follow up therapy as arranged by surgeon    Equipment Recommendations  Rolling walker with 5" wheels    Recommendations for Other Services       Precautions / Restrictions Precautions Precautions: Fall Restrictions Weight Bearing Restrictions: No      Mobility  Bed Mobility Overal bed mobility: Needs Assistance Bed Mobility: Supine to Sit     Supine to sit: Min assist;HOB elevated     General bed mobility comments: used sheet to self assist the right leg  Transfers Overall transfer level: Needs assistance Equipment used: Rolling walker (2 wheeled) Transfers: Sit to/from Stand Sit to Stand: Min assist         General transfer comment: cues for technique  Ambulation/Gait Ambulation/Gait assistance: Min assist Ambulation Distance (Feet): 44 Feet Assistive device: Rolling walker (2 wheeled) Gait Pattern/deviations: Step-to pattern;Step-through pattern     General Gait Details: cues for sequence  Stairs            Wheelchair Mobility    Modified Rankin (Stroke Patients Only)       Balance                                             Pertinent Vitals/Pain Pain Assessment: 0-10 Pain Score: 2  Pain Descriptors / Indicators: Sore Pain Intervention(s): Limited activity within patient's tolerance;Monitored during session;Repositioned;Ice  applied    Home Living Family/patient expects to be discharged to:: Private residence Living Arrangements: Spouse/significant other;Children Available Help at Discharge: Family Type of Home: House Home Access: Stairs to enter Entrance Stairs-Rails: None Entrance Stairs-Number of Steps: 3 Home Layout: Two level;Full bath on main level;Able to live on main level with bedroom/bathroom Home Equipment: Dan HumphreysWalker - 4 wheels(may be able to borrow 2 wheeled)      Prior Function Level of Independence: Independent               Hand Dominance        Extremity/Trunk Assessment   Upper Extremity Assessment Upper Extremity Assessment: Defer to OT evaluation    Lower Extremity Assessment Lower Extremity Assessment: RLE deficits/detail RLE Deficits / Details: able to advance the leg    Cervical / Trunk Assessment Cervical / Trunk Assessment: Normal  Communication   Communication: No difficulties  Cognition Arousal/Alertness: Awake/alert Behavior During Therapy: WFL for tasks assessed/performed Overall Cognitive Status: Within Functional Limits for tasks assessed                                        General Comments      Exercises Total Joint Exercises Heel Slides: AAROM;Right;10 reps   Assessment/Plan    PT Assessment Patient needs continued PT services  PT Problem List Decreased strength;Decreased range  of motion;Decreased activity tolerance;Decreased safety awareness;Decreased balance;Decreased mobility       PT Treatment Interventions DME instruction;Gait training;Stair training;Functional mobility training;Therapeutic activities;Therapeutic exercise;Patient/family education    PT Goals (Current goals can be found in the Care Plan section)  Acute Rehab PT Goals Patient Stated Goal: to play golf PT Goal Formulation: With patient/family Time For Goal Achievement: 12/24/17 Potential to Achieve Goals: Good    Frequency 7X/week   Barriers to  discharge        Co-evaluation               AM-PAC PT "6 Clicks" Daily Activity  Outcome Measure Difficulty turning over in bed (including adjusting bedclothes, sheets and blankets)?: A Little Difficulty moving from lying on back to sitting on the side of the bed? : A Little Difficulty sitting down on and standing up from a chair with arms (e.g., wheelchair, bedside commode, etc,.)?: A Little Help needed moving to and from a bed to chair (including a wheelchair)?: A Little Help needed walking in hospital room?: A Little Help needed climbing 3-5 steps with a railing? : A Lot 6 Click Score: 17    End of Session   Activity Tolerance: Patient tolerated treatment well Patient left: in chair;with call bell/phone within reach;with family/visitor present Nurse Communication: Mobility status PT Visit Diagnosis: Difficulty in walking, not elsewhere classified (R26.2);Pain Pain - Right/Left: Right Pain - part of body: Hip    Time: [id number] PT Time Calculation (min) (ACUTE ONLY): 29 min   Charges:   PT Evaluation $PT Eval Low Complexity: 1 Low PT Treatments $Gait Training: 8-22 mins   PT G Codes:          Rada HayHill, Kuron Docken Elizabeth 12/17/2017, 4:45 PM

## 2017-12-18 LAB — CBC
HEMATOCRIT: 37.3 % — AB (ref 39.0–52.0)
HEMOGLOBIN: 12.4 g/dL — AB (ref 13.0–17.0)
MCH: 28.9 pg (ref 26.0–34.0)
MCHC: 33.2 g/dL (ref 30.0–36.0)
MCV: 86.9 fL (ref 78.0–100.0)
Platelets: 220 10*3/uL (ref 150–400)
RBC: 4.29 MIL/uL (ref 4.22–5.81)
RDW: 13.9 % (ref 11.5–15.5)
WBC: 9.7 10*3/uL (ref 4.0–10.5)

## 2017-12-18 LAB — BASIC METABOLIC PANEL
ANION GAP: 6 (ref 5–15)
BUN: 12 mg/dL (ref 6–20)
CALCIUM: 9 mg/dL (ref 8.9–10.3)
CHLORIDE: 108 mmol/L (ref 101–111)
CO2: 24 mmol/L (ref 22–32)
CREATININE: 0.88 mg/dL (ref 0.61–1.24)
GFR calc Af Amer: >60 mL/min (ref >60)
GFR calc non Af Amer: >60 mL/min (ref >60)
GLUCOSE: 127 mg/dL — AB (ref 65–99)
Potassium: 3.9 mmol/L (ref 3.5–5.1)
Sodium: 138 mmol/L (ref 135–145)

## 2017-12-18 NOTE — Progress Notes (Signed)
Physical Therapy Treatment Patient Details Name: Dalton Reynolds MRN: [id number] DOB: [birth year] Today's Date: 12/18/2017    History of Present Illness right Direct anterior THA    PT Comments    Patient is ready for DC.   Follow Up Recommendations  DC plan and follow up therapy as arranged by surgeon     Equipment Recommendations  None recommended by PT    Recommendations for Other Services       Precautions / Restrictions Precautions Precautions: Fall Restrictions Weight Bearing Restrictions: No    Mobility  Bed Mobility Overal bed mobility: Needs Assistance Bed Mobility: Supine to Sit;Sit to Supine     Supine to sit: Supervision Sit to supine: Supervision   General bed mobility comments: supervision;  cues for sequence, use of strap  around foot for onto bed  Transfers Overall transfer level: Needs assistance Equipment used: Rolling walker (2 wheeled) Transfers: Sit to/from Stand Sit to Stand: Supervision            Ambulation/Gait Ambulation/Gait assistance: Supervision Ambulation Distance (Feet): 300 Feet Assistive device: Rolling walker (2 wheeled) Gait Pattern/deviations: Step-to pattern;Step-through pattern     General Gait Details: cues for sequence   Stairs Stairs: Yes   Stair Management: Forwards;With cane Number of Stairs: 5 General stair comments: simulated a wall on left  Wheelchair Mobility    Modified Rankin (Stroke Patients Only)       Balance                                            Cognition Arousal/Alertness: Awake/alert Behavior During Therapy: WFL for tasks assessed/performed Overall Cognitive Status: Within Functional Limits for tasks assessed                                        Exercises Total Joint Exercises Ankle Circles/Pumps: AROM;Both;10 reps Quad Sets: AROM;Both Short Arc Quad: AROM;Right;10 reps Heel Slides: AROM;Right;10 reps Hip ABduction/ADduction: 10  reps;Right Long Arc Quad: AROM;Right;Seated;10 reps Marching in Standing: AROM;Seated;Right;10 reps Standing Hip Extension: AROM    General Comments        Pertinent Vitals/Pain Pain Score: 2  Pain Location: right thigh Pain Descriptors / Indicators: Sore Pain Intervention(s): Monitored during session;Premedicated before session;Ice applied    Home Living Family/patient expects to be discharged to:: Private residence Living Arrangements: Spouse/significant other;Children Available Help at Discharge: Family         Home Equipment: Shower seat;Bedside commode(adjustable bed)      Prior Function Level of Independence: Independent          PT Goals (current goals can now be found in the care plan section) Acute Rehab PT Goals Patient Stated Goal: to play golf Progress towards PT goals: Progressing toward goals    Frequency    7X/week      PT Plan Current plan remains appropriate    Co-evaluation              AM-PAC PT "6 Clicks" Daily Activity  Outcome Measure  Difficulty turning over in bed (including adjusting bedclothes, sheets and blankets)?: A Little Difficulty moving from lying on back to sitting on the side of the bed? : A Little Difficulty sitting down on and standing up from a chair with arms (e.g., wheelchair, bedside commode, etc,.)?: A Little  Help needed moving to and from a bed to chair (including a wheelchair)?: A Little Help needed walking in hospital room?: A Little Help needed climbing 3-5 steps with a railing? : A Little 6 Click Score: 18    End of Session   Activity Tolerance: Patient tolerated treatment well Patient left: in chair;with family/visitor present Nurse Communication: Mobility status PT Visit Diagnosis: Difficulty in walking, not elsewhere classified (R26.2);Pain Pain - Right/Left: Right Pain - part of body: Hip     Time: [id number] PT Time Calculation (min) (ACUTE ONLY): 48 min  Charges:  $Gait Training: 8-22  mins $Therapeutic Exercise: 8-22 mins $Self Care/Home Management: 8-22                    G Codes:          Rada HayHill, Kermit Arnette Elizabeth 12/18/2017, 10:26 AM

## 2017-12-18 NOTE — Evaluation (Signed)
Occupational Therapy Evaluation Patient Details Name: Dalton Reynolds MRN: [id number] DOB: 07/08/1951 Today's Date: 12/18/2017    History of Present Illness right Direct anterior THA   Clinical Impression   This 66 year old man was admitted for the above sx. All education was completed. No further OT is needed at this time     Follow Up Recommendations  No OT follow up    Equipment Recommendations  None recommended by OT    Recommendations for Other Services       Precautions / Restrictions Precautions Precautions: Fall Restrictions Weight Bearing Restrictions: No      Mobility Bed Mobility               General bed mobility comments: supervision;  cues for sequence  Transfers   Equipment used: Rolling walker (2 wheeled)   Sit to Stand: Supervision              Balance                                           ADL either performed or assessed with clinical judgement   ADL Overall ADL's : Needs assistance/impaired Eating/Feeding: Independent   Grooming: Supervision/safety;Standing   Upper Body Bathing: Set up;Standing   Lower Body Bathing: Minimal assistance;Sit to/from stand   Upper Body Dressing : Set up;Sitting   Lower Body Dressing: Moderate assistance;Sit to/from stand   Toilet Transfer: Supervision/safety;Comfort height toilet;BSC;RW   Toileting- ArchitectClothing Manipulation and Hygiene: Supervision/safety;Sit to/from stand         General ADL Comments: pt moving well. Will have 24/7 assistance. Wife assisted him with dressing this am.  Reinforced working within pain tolerance     Vision         Perception     Praxis      Pertinent Vitals/Pain Pain Score: 2  Pain Descriptors / Indicators: Sore(R hip) Pain Intervention(s): Limited activity within patient's tolerance;Monitored during session;Premedicated before session;Repositioned;Ice applied     Hand Dominance     Extremity/Trunk Assessment Upper  Extremity Assessment Upper Extremity Assessment: Overall WFL for tasks assessed           Communication Communication Communication: No difficulties   Cognition Arousal/Alertness: Awake/alert Behavior During Therapy: WFL for tasks assessed/performed Overall Cognitive Status: Within Functional Limits for tasks assessed                                     General Comments       Exercises     Shoulder Instructions      Home Living Family/patient expects to be discharged to:: Private residence Living Arrangements: Spouse/significant other;Children Available Help at Discharge: Family               Bathroom Shower/Tub: Walk-in shower(accessible)   FirefighterBathroom Toilet: Standard     Home Equipment: Shower seat;Bedside commode(adjustable bed)          Prior Functioning/Environment Level of Independence: Independent                 OT Problem List:        OT Treatment/Interventions:      OT Goals(Current goals can be found in the care plan section) Acute Rehab OT Goals Patient Stated Goal: to play golf OT Goal Formulation: All assessment and education complete,  DC therapy  OT Frequency:     Barriers to D/C:            Co-evaluation              AM-PAC PT "6 Clicks" Daily Activity     Outcome Measure Help from another person eating meals?: None Help from another person taking care of personal grooming?: A Little Help from another person toileting, which includes using toliet, bedpan, or urinal?: A Little Help from another person bathing (including washing, rinsing, drying)?: A Little Help from another person to put on and taking off regular upper body clothing?: A Little Help from another person to put on and taking off regular lower body clothing?: A Lot 6 Click Score: 18   End of Session    Activity Tolerance: Patient tolerated treatment well Patient left: in chair;with call bell/phone within reach;with family/visitor  present  OT Visit Diagnosis: Pain Pain - Right/Left: Right Pain - part of body: Hip                Time: [id number] OT Time Calculation (min): 21 min Charges:  OT General Charges $OT Visit: 1 Visit OT Evaluation $OT Eval Low Complexity: 1 Low G-Codes:     Marica OtterMaryellen Calder Oblinger, OTR/L [phone] 12/18/2017  Dalton Reynolds 12/18/2017, 8:31 AM

## 2017-12-18 NOTE — Progress Notes (Signed)
Physical Therapy Treatment Patient Details Name: Dalton Reynolds MRN: [id number] DOB: 02-29-1951 Today's Date: 12/18/2017    History of Present Illness right Direct anterior THA    PT Comments    Patient practiced steps again. Ready for Dc.   Follow Up Recommendations  DC plan and follow up therapy as arranged by surgeon;Home health PT     Equipment Recommendations  None recommended by PT    Recommendations for Other Services       Precautions / Restrictions Precautions Precautions: Fall Restrictions Weight Bearing Restrictions: No    Mobility  Bed Mobility Overal bed mobility: Needs Assistance Bed Mobility: Supine to Sit;Sit to Supine     Supine to sit: Supervision Sit to supine: Supervision   General bed mobility comments: in recliner  Transfers Overall transfer level: Needs assistance Equipment used: Rolling walker (2 wheeled) Transfers: Sit to/from Stand Sit to Stand: Supervision            Ambulation/Gait Ambulation/Gait assistance: Supervision Ambulation Distance (Feet): 200 Feet Assistive device: Rolling walker (2 wheeled) Gait Pattern/deviations: Step-to pattern;Step-through pattern     General Gait Details: cues for sequence   Stairs Stairs: Yes   Stair Management: Forwards;With cane Number of Stairs: 3 General stair comments: simulated a wall on left  Wheelchair Mobility    Modified Rankin (Stroke Patients Only)       Balance                                            Cognition Arousal/Alertness: Awake/alert Behavior During Therapy: WFL for tasks assessed/performed Overall Cognitive Status: Within Functional Limits for tasks assessed                                        Exercises Total Joint Exercises Ankle Circles/Pumps: AROM;Both;10 reps Quad Sets: AROM;Both Short Arc Quad: AROM;Right;10 reps Heel Slides: AROM;Right;10 reps Hip ABduction/ADduction: 10 reps;Right Long Arc Quad:  AROM;Right;Seated;10 reps Marching in Standing: AROM;Seated;Right;10 reps Standing Hip Extension: AROM    General Comments        Pertinent Vitals/Pain Pain Score: 2  Pain Location: right thigh Pain Descriptors / Indicators: Sore Pain Intervention(s): Monitored during session;Premedicated before session    Home Living Family/patient expects to be discharged to:: Private residence Living Arrangements: Spouse/significant other;Children Available Help at Discharge: Family         Home Equipment: Shower seat;Bedside commode(adjustable bed)      Prior Function Level of Independence: Independent          PT Goals (current goals can now be found in the care plan section) Acute Rehab PT Goals Patient Stated Goal: to play golf Progress towards PT goals: Progressing toward goals    Frequency    7X/week      PT Plan Current plan remains appropriate    Co-evaluation              AM-PAC PT "6 Clicks" Daily Activity  Outcome Measure  Difficulty turning over in bed (including adjusting bedclothes, sheets and blankets)?: None Difficulty moving from lying on back to sitting on the side of the bed? : None Difficulty sitting down on and standing up from a chair with arms (e.g., wheelchair, bedside commode, etc,.)?: None Help needed moving to and from a bed to chair (including a  wheelchair)?: None Help needed walking in hospital room?: A Little Help needed climbing 3-5 steps with a railing? : A Little 6 Click Score: 22    End of Session   Activity Tolerance: Patient tolerated treatment well Patient left: in chair;with family/visitor present Nurse Communication: Mobility status PT Visit Diagnosis: Difficulty in walking, not elsewhere classified (R26.2);Pain Pain - Right/Left: Right Pain - part of body: Hip     Time: [id number] PT Time Calculation (min) (ACUTE ONLY): 16 min  Charges:  $Gait Training: 8-22 mins                    G CodesBlanchard Reynolds:       Dalton Reynolds  PT [phone] }   Dalton Reynolds, Dalton Reynolds 12/18/2017, 11:43 AM

## 2017-12-18 NOTE — Progress Notes (Signed)
Patient discharged to home with family. Given all belongings, instructions, prescriptions, equipment. Family present for teaching. Verbalized understanding of instructions. Escorted to pov via wc.

## 2017-12-18 NOTE — Progress Notes (Signed)
Discharge planning, spoke with patient and spouse at beside. Chose [facility] for HH services, contacted [facility] for referral. Has RW and 3-n-1. [phone] 

## 2017-12-18 NOTE — Progress Notes (Signed)
   Subjective: 1 Day Post-Op Procedure(s) (LRB): RIGHT  TOTAL HIP ARTHROPLASTY ANTERIOR APPROACH (Right) Patient reports pain as mild.   Patient seen in rounds with Dr. Lequita HaltAluisio. Patient is well, but has had some minor complaints of pain in the hip, requiring pain medications We will start therapy today.  If they do well with therapy and meets all goals, then will allow home later this afternoon following therapy. Plan is to go Home after hospital stay.  Objective: Vital signs in last 24 hours: Temp:  [97.6 F (36.4 C)-98.9 F (37.2 C)] 98 F (36.7 C) (12/20 0526) Pulse Rate:  [58-88] 74 (12/20 0526) Resp:  [12-18] 18 (12/20 0526) BP: (115-138)/(50-88) 119/65 (12/20 0526) SpO2:  [96 %-100 %] 97 % (12/20 0526) Weight:  [121.1 kg (267 lb)] 121.1 kg (267 lb) (12/19 0800)  Intake/Output from previous day:  Intake/Output Summary (Last 24 hours) at 12/18/2017 0705 Last data filed at 12/18/2017 0629 Gross per 24 hour  Intake 4647.67 ml  Output 2790 ml  Net 1857.67 ml    Intake/Output this shift: No intake/output data recorded.  Labs: Recent Labs    12/18/17 0527  HGB 12.4*   Recent Labs    12/18/17 0527  WBC 9.7  RBC 4.29  HCT 37.3*  PLT 220   Recent Labs    12/18/17 0527  NA 138  K 3.9  CL 108  CO2 24  BUN 12  CREATININE 0.88  GLUCOSE 127*  CALCIUM 9.0   No results for input(s): LABPT, INR in the last 72 hours.  EXAM General - Patient is Appropriate Extremity - Neurovascular intact Sensation intact distally Intact pulses distally Dressing - dressing C/D/I Motor Function - intact, moving foot and toes well on exam.  Hemovac pulled without difficulty.  Past Medical History:  Diagnosis Date  . Arthritis   . Headache    migraines  . High triglycerides   . History of kidney stones   . Sleep apnea    bipap    Assessment/Plan: 1 Day Post-Op Procedure(s) (LRB): RIGHT  TOTAL HIP ARTHROPLASTY ANTERIOR APPROACH (Right) Principal Problem:   OA  (osteoarthritis) of hip  Estimated body mass index is 40.6 kg/m as calculated from the following:   Height as of this encounter: 5\' 8"  (1.727 m).   Weight as of this encounter: 121.1 kg (267 lb). Up with therapy  DVT Prophylaxis - Xarelto Weight Bearing As Tolerated right Leg Hemovac Pulled Begin Therapy  If meets goals and able to go home: Discharge home with home health Diet - Regular diet Follow up - in 2 weeks Activity - WBAT Disposition - Home Condition Upon Discharge - pending therapy D/C Meds - See DC Summary DVT Prophylaxis - Xarelto  Avel Peacerew Perkins, PA-C Orthopaedic Surgery 12/18/2017, 7:05 AM

## 2017-12-31 NOTE — Discharge Summary (Signed)
Physician Discharge Summary   Patient ID: Dalton Reynolds MRN: [id number] DOB/AGE: 1951-06-07 67 y.o.  Admit date: 12/17/2017 Discharge date: 12/18/2017  Primary Diagnosis:  Osteoarthritis of the Right  hip.    Admission Diagnoses:  Past Medical History:  Diagnosis Date  . Arthritis   . Headache    migraines  . High triglycerides   . History of kidney stones   . Sleep apnea    bipap   Discharge Diagnoses:   Principal Problem:   OA (osteoarthritis) of hip  Estimated body mass index is 40.6 kg/m as calculated from the following:   Height as of this encounter: '5\' 8"'  (1.727 m).   Weight as of this encounter: 121.1 kg (267 lb).  Procedure(s) (LRB): RIGHT  TOTAL HIP ARTHROPLASTY ANTERIOR APPROACH (Right)   Consults: None  HPI: Dalton Reynolds is a 67 y.o. male who has advanced end-  stage arthritis of their Right  hip with progressively worsening pain and  dysfunction.The patient has failed nonoperative management and presents for  total hip arthroplasty.    Laboratory Data: Admission on 12/17/2017, Discharged on 12/18/2017  Component Date Value Ref Range Status  . WBC 12/18/2017 9.7  4.0 - 10.5 K/uL Final  . RBC 12/18/2017 4.29  4.22 - 5.81 MIL/uL Final  . Hemoglobin 12/18/2017 12.4* 13.0 - 17.0 g/dL Final  . HCT 12/18/2017 37.3* 39.0 - 52.0 % Final  . MCV 12/18/2017 86.9  78.0 - 100.0 fL Final  . MCH 12/18/2017 28.9  26.0 - 34.0 pg Final  . MCHC 12/18/2017 33.2  30.0 - 36.0 g/dL Final  . RDW 12/18/2017 13.9  11.5 - 15.5 % Final  . Platelets 12/18/2017 220  150 - 400 K/uL Final  . Sodium 12/18/2017 138  135 - 145 mmol/L Final  . Potassium 12/18/2017 3.9  3.5 - 5.1 mmol/L Final  . Chloride 12/18/2017 108  101 - 111 mmol/L Final  . CO2 12/18/2017 24  22 - 32 mmol/L Final  . Glucose, Bld 12/18/2017 127* 65 - 99 mg/dL Final  . BUN 12/18/2017 12  6 - 20 mg/dL Final  . Creatinine, Ser 12/18/2017 0.88  0.61 - 1.24 mg/dL Final  . Calcium 12/18/2017 9.0  8.9 - 10.3 mg/dL  Final  . GFR calc non Af Amer 12/18/2017 >60  >60 mL/min Final  . GFR calc Af Amer 12/18/2017 >60  >60 mL/min Final   Comment: (NOTE) The eGFR has been calculated using the CKD EPI equation. This calculation has not been validated in all clinical situations. eGFR's persistently <60 mL/min signify possible Chronic Kidney Disease.   Georgiann Hahn gap 12/18/2017 6  5 - 15 Final  Hospital Outpatient Visit on 12/12/2017  Component Date Value Ref Range Status  . aPTT 12/12/2017 27  24 - 36 seconds Final  . WBC 12/12/2017 4.9  4.0 - 10.5 K/uL Final  . RBC 12/12/2017 4.96  4.22 - 5.81 MIL/uL Final  . Hemoglobin 12/12/2017 15.0  13.0 - 17.0 g/dL Final  . HCT 12/12/2017 43.1  39.0 - 52.0 % Final  . MCV 12/12/2017 86.9  78.0 - 100.0 fL Final  . MCH 12/12/2017 30.2  26.0 - 34.0 pg Final  . MCHC 12/12/2017 34.8  30.0 - 36.0 g/dL Final  . RDW 12/12/2017 13.7  11.5 - 15.5 % Final  . Platelets 12/12/2017 231  150 - 400 K/uL Final  . Sodium 12/12/2017 141  135 - 145 mmol/L Final  . Potassium 12/12/2017 4.0  3.5 - 5.1 mmol/L Final  .  Chloride 12/12/2017 108  101 - 111 mmol/L Final  . CO2 12/12/2017 23  22 - 32 mmol/L Final  . Glucose, Bld 12/12/2017 84  65 - 99 mg/dL Final  . BUN 12/12/2017 15  6 - 20 mg/dL Final  . Creatinine, Ser 12/12/2017 0.98  0.61 - 1.24 mg/dL Final  . Calcium 12/12/2017 9.5  8.9 - 10.3 mg/dL Final  . Total Protein 12/12/2017 7.4  6.5 - 8.1 g/dL Final  . Albumin 12/12/2017 4.2  3.5 - 5.0 g/dL Final  . AST 12/12/2017 23  15 - 41 U/L Final  . ALT 12/12/2017 21  17 - 63 U/L Final  . Alkaline Phosphatase 12/12/2017 53  38 - 126 U/L Final  . Total Bilirubin 12/12/2017 0.8  0.3 - 1.2 mg/dL Final  . GFR calc non Af Amer 12/12/2017 >60  >60 mL/min Final  . GFR calc Af Amer 12/12/2017 >60  >60 mL/min Final   Comment: (NOTE) The eGFR has been calculated using the CKD EPI equation. This calculation has not been validated in all clinical situations. eGFR's persistently <60 mL/min signify  possible Chronic Kidney Disease.   . Anion gap 12/12/2017 10  5 - 15 Final  . Prothrombin Time 12/12/2017 13.7  11.4 - 15.2 seconds Final  . INR 12/12/2017 1.06   Final  . ABO/RH(D) 12/12/2017 A POS   Final  . Antibody Screen 12/12/2017 NEG   Final  . Sample Expiration 12/12/2017 12/20/2017   Final  . Extend sample reason 12/12/2017 NO TRANSFUSIONS OR PREGNANCY IN THE PAST 3 MONTHS   Final  . MRSA, PCR 12/12/2017 NEGATIVE  NEGATIVE Final  . Staphylococcus aureus 12/12/2017 NEGATIVE  NEGATIVE Final   Comment: (NOTE) The Xpert SA Assay (FDA approved for NASAL specimens in patients 45 years of age and older), is one component of a comprehensive surveillance program. It is not intended to diagnose infection nor to guide or monitor treatment.   . ABO/RH(D) 12/12/2017 A POS   Final     X-Rays:Dg Pelvis Portable  Result Date: 12/17/2017 CLINICAL DATA:  Hip arthroplasty. EXAM: PORTABLE PELVIS 1-2 VIEWS COMPARISON:  Same day FINDINGS: Single image shows total hip arthroplasty on the right. Soft tissue drain is in place. Components are well positioned. No radiographically detectable complication. IMPRESSION: Good appearance following total hip arthroplasty. Electronically Signed   By: Nelson Chimes M.D.   On: 12/17/2017 12:29   Dg C-arm 1-60 Min-no Report  Result Date: 12/17/2017 Fluoroscopy was utilized by the requesting physician.  No radiographic interpretation.    EKG:No orders found for this or any previous visit.   Hospital Course: Patient was admitted to [facility] and taken to the OR and underwent the above state procedure without complications.  Patient tolerated the procedure well and was later transferred to the recovery room and then to the orthopaedic floor for postoperative care.  They were given PO and IV analgesics for pain control following their surgery.  They were given 24 hours of postoperative antibiotics of  Anti-infectives (From admission, onward)   Start      Dose/Rate Route Frequency Ordered Stop   12/18/17 1000  sulfamethoxazole-trimethoprim (BACTRIM DS,SEPTRA DS) 800-160 MG per tablet 1 tablet  Status:  Discontinued     1 tablet Oral Daily 12/17/17 1237 12/18/17 1554   12/17/17 1600  ceFAZolin (ANCEF) IVPB 2g/100 mL premix     2 g 200 mL/hr over 30 Minutes Intravenous Every 6 hours 12/17/17 1237 12/18/17 0001   12/17/17 0745  ceFAZolin (ANCEF) 3 g in dextrose 5 % 50 mL IVPB     3 g 130 mL/hr over 30 Minutes Intravenous On call to O.R. 12/17/17 6433 12/17/17 1156     and started on DVT prophylaxis in the form of Xarelto.   PT and OT were ordered for total hip protocol.  The patient was allowed to be WBAT with therapy. Discharge planning was consulted to help with postop disposition and equipment needs.  Patient had a good night on the evening of surgery.  They started to get up OOB with therapy on day one.  Hemovac drain was pulled without difficulty.  Dressing was checked and was clean and dry.  Patient was seen in rounds and was ready to go home later that same day following therapy.  Discharge home with home health Diet - Regular diet Follow up - in 2 weeks Activity - WBAT Disposition - Home Condition Upon Discharge - stable D/C Meds - See DC Summary DVT Prophylaxis - Xarelto     Discharge Instructions    Call MD / Call 911   Complete by:  As directed    If you experience chest pain or shortness of breath, CALL 911 and be transported to the hospital emergency room.  If you develope a fever above 101 F, pus (white drainage) or increased drainage or redness at the wound, or calf pain, call your surgeon's office.   Change dressing   Complete by:  As directed    You may change your dressing dressing daily with sterile 4 x 4 inch gauze dressing and paper tape.  Do not submerge the incision under water.   Constipation Prevention   Complete by:  As directed    Drink plenty of fluids.  Prune juice may be helpful.  You may use a stool  softener, such as Colace (over the counter) 100 mg twice a day.  Use MiraLax (over the counter) for constipation as needed.   Diet - low sodium heart healthy   Complete by:  As directed    Discharge instructions   Complete by:  As directed    Take Xarelto for two and a half more weeks, then discontinue Xarelto. Once the patient has completed the Xarelto, they may resume the 81 mg Aspirin.   Pick up stool softner and laxative for home use following surgery while on pain medications. Do not submerge incision under water. Please use good hand washing techniques while changing dressing each day. May shower starting three days after surgery. Please use a clean towel to pat the incision dry following showers. Continue to use ice for pain and swelling after surgery. Do not use any lotions or creams on the incision until instructed by your surgeon.  Wear both TED hose on both legs during the day every day for three weeks, but may remove the TED hose at night at home.  Postoperative Constipation Protocol  Constipation - defined medically as fewer than three stools per week and severe constipation as less than one stool per week.  One of the most common issues patients have following surgery is constipation.  Even if you have a regular bowel pattern at home, your normal regimen is likely to be disrupted due to multiple reasons following surgery.  Combination of anesthesia, postoperative narcotics, change in appetite and fluid intake all can affect your bowels.  In order to avoid complications following surgery, here are some recommendations in order to help you during your recovery period.  Colace (docusate) -  Pick up an over-the-counter form of Colace or another stool softener and take twice a day as long as you are requiring postoperative pain medications.  Take with a full glass of water daily.  If you experience loose stools or diarrhea, hold the colace until you stool forms back up.  If your  symptoms do not get better within 1 week or if they get worse, check with your doctor.  Dulcolax (bisacodyl) - Pick up over-the-counter and take as directed by the product packaging as needed to assist with the movement of your bowels.  Take with a full glass of water.  Use this product as needed if not relieved by Colace only.   MiraLax (polyethylene glycol) - Pick up over-the-counter to have on hand.  MiraLax is a solution that will increase the amount of water in your bowels to assist with bowel movements.  Take as directed and can mix with a glass of water, juice, soda, coffee, or tea.  Take if you go more than two days without a movement. Do not use MiraLax more than once per day. Call your doctor if you are still constipated or irregular after using this medication for 7 days in a row.  If you continue to have problems with postoperative constipation, please contact the office for further assistance and recommendations.  If you experience "the worst abdominal pain ever" or develop nausea or vomiting, please contact the office immediatly for further recommendations for treatment.   Do not sit on low chairs, stoools or toilet seats, as it may be difficult to get up from low surfaces   Complete by:  As directed    Driving restrictions   Complete by:  As directed    No driving until released by the physician.   Increase activity slowly as tolerated   Complete by:  As directed    Lifting restrictions   Complete by:  As directed    No lifting until released by the physician.   Patient may shower   Complete by:  As directed    You may shower without a dressing once there is no drainage.  Do not wash over the wound.  If drainage remains, do not shower until drainage stops.   TED hose   Complete by:  As directed    Use stockings (TED hose) for 3 weeks on both leg(s).  You may remove them at night for sleeping.   Weight bearing as tolerated   Complete by:  As directed    Laterality:  right    Extremity:  Lower     Allergies as of 12/18/2017   No Known Allergies     Medication List    STOP taking these medications   aspirin EC 81 MG tablet   DOCOSAHEXAENOIC ACID PO   VITAMIN D-1000 MAX ST 1000 units tablet Generic drug:  Cholecalciferol     TAKE these medications   doxazosin 1 MG tablet Commonly known as:  CARDURA Take 1 mg by mouth daily.   fenofibrate 160 MG tablet Take 160 mg by mouth daily.   methocarbamol 500 MG tablet Commonly known as:  ROBAXIN Take 1 tablet (500 mg total) by mouth every 6 (six) hours as needed for muscle spasms.   oxyCODONE 5 MG immediate release tablet Commonly known as:  Oxy IR/ROXICODONE Take 1-2 tablets (5-10 mg total) by mouth every 4 (four) hours as needed for moderate pain or severe pain.   PRESCRIPTION MEDICATION Place 1 application into the nose at  bedtime. BYPAP MASK USED AT BEDTIME   rivaroxaban 10 MG Tabs tablet Commonly known as:  XARELTO Take 1 tablet (10 mg total) by mouth daily with breakfast. Take Xarelto for two and a half more weeks following discharge from the hospital, then discontinue Xarelto. Once the patient has completed the Xarelto, they may resume the 81 mg Aspirin.   sulfamethoxazole-trimethoprim 800-160 MG tablet Commonly known as:  BACTRIM DS,SEPTRA DS Take 1 tablet by mouth daily.   traMADol 50 MG tablet Commonly known as:  ULTRAM Take 1-2 tablets (50-100 mg total) by mouth every 6 (six) hours as needed for moderate pain (try 60m OxyIR first for moderate pain).            Discharge Care Instructions  (From admission, onward)        Start     Ordered   12/17/17 0000  Weight bearing as tolerated    Question Answer Comment  Laterality right   Extremity Lower      12/17/17 1534   12/17/17 0000  Change dressing    Comments:  You may change your dressing dressing daily with sterile 4 x 4 inch gauze dressing and paper tape.  Do not submerge the incision under water.   12/17/17 1534      Follow-up Information    AGaynelle Arabian MD. Schedule an appointment as soon as possible for a visit on 01/01/2018.   Specialty:  Orthopedic Surgery Contact information: [address] [place] [phone]        [facility] Follow up.   Specialty:  [place]  physical therapy Contact information: [phone]          Signed: DArlee Muslim PA-C Orthopaedic Surgery 12/31/2017, 8:08 AM

## 2018-12-24 IMAGING — DX DG PORTABLE PELVIS
1 series · 1 of 1 positions shown · non-contrast
Comparison: Same day

CLINICAL DATA: Hip arthroplasty.

EXAM:
PORTABLE PELVIS 1-2 VIEWS

[pelvis ap]
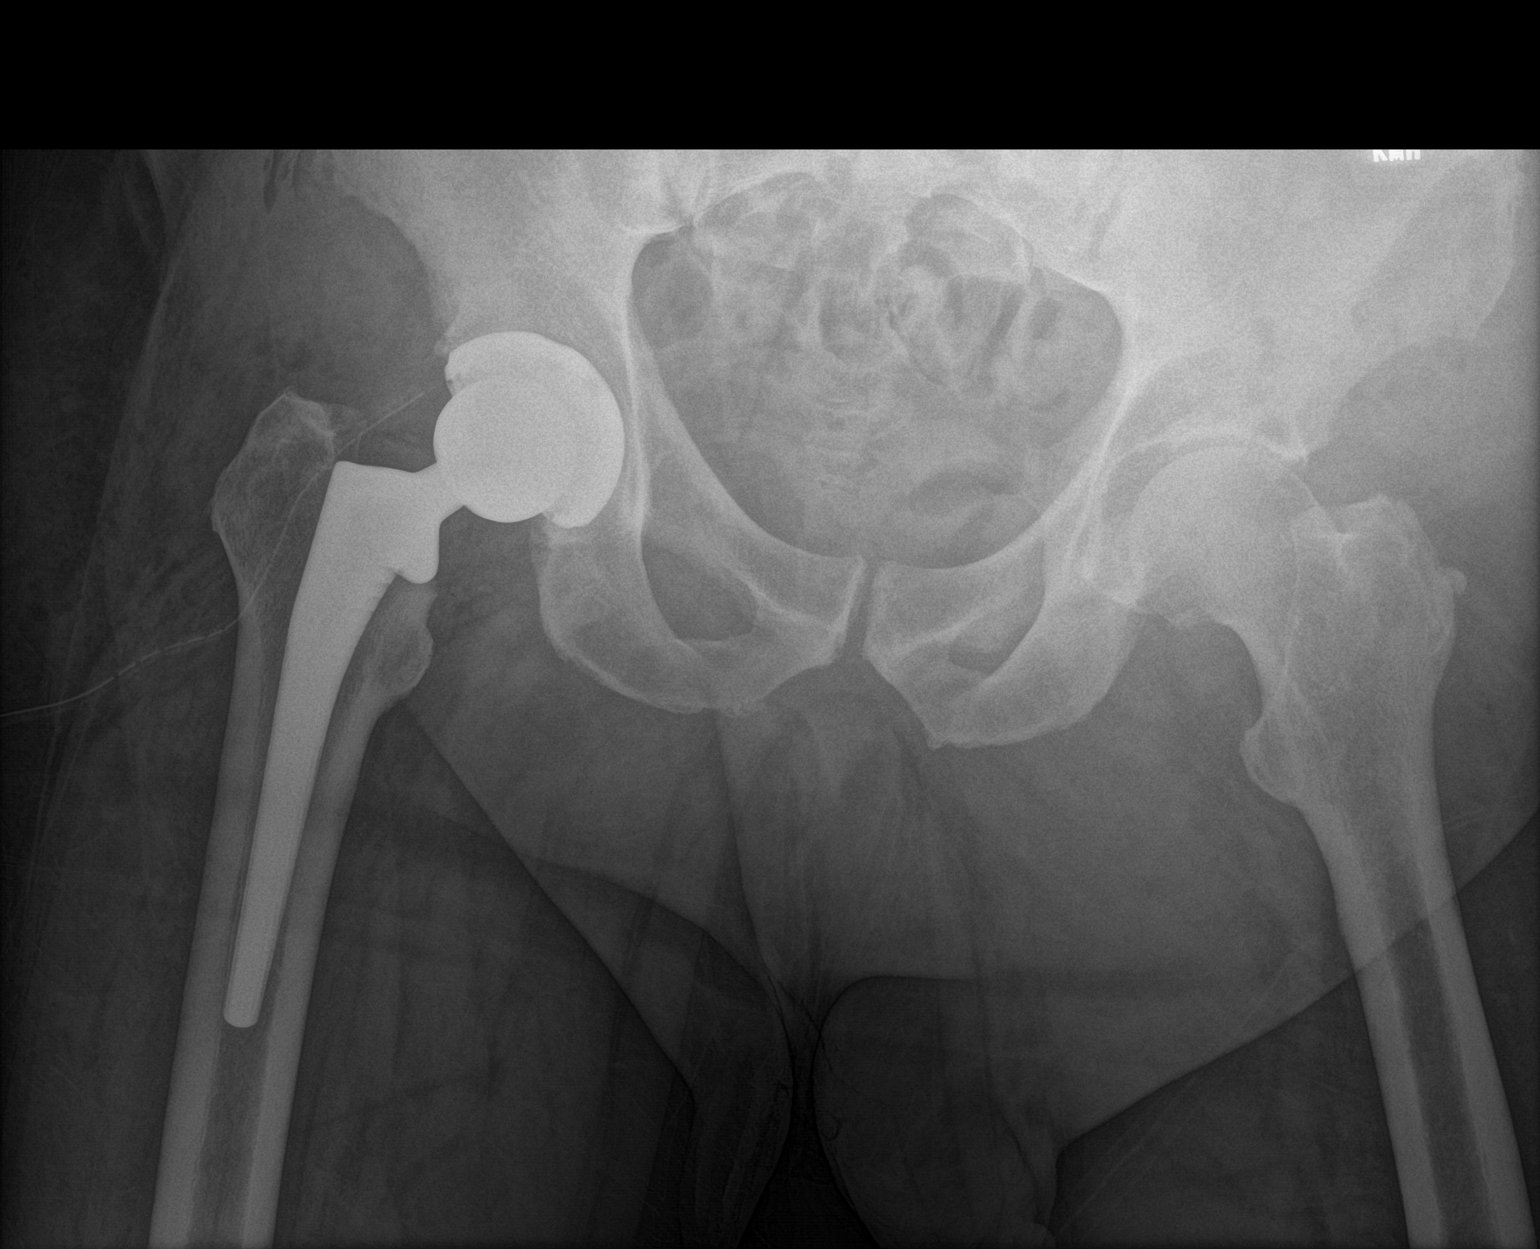

[1 of 1 positions shown; findings below may reference images not displayed]

FINDINGS: Single image shows total hip arthroplasty on the right. Soft tissue
drain is in place. Components are well positioned. No
radiographically detectable complication.
IMPRESSION: Good appearance following total hip arthroplasty.

## 2020-01-19 ENCOUNTER — Ambulatory Visit: Payer: Medicare Other | Attending: Internal Medicine

## 2020-01-19 DIAGNOSIS — Z23 Encounter for immunization: Secondary | ICD-10-CM | POA: Insufficient documentation

## 2020-01-19 NOTE — Progress Notes (Signed)
   Covid-19 Vaccination Clinic  Name:  Suhas Estis    MRN: [id number] DOB: April 07, 1951  01/19/2020  Mr. Ohms was observed post Covid-19 immunization for 15 minutes without incidence. He was provided with Vaccine Information Sheet and instruction to access the V-Safe system.   Mr. Ehrman was instructed to call 911 with any severe reactions post vaccine: Marland Kitchen Difficulty breathing  . Swelling of your face and throat  . A fast heartbeat  . A bad rash all over your body  . Dizziness and weakness    Immunizations Administered    Name Date Dose VIS Date Route   Pfizer COVID-19 Vaccine 01/19/2020 10:39 AM 0.3 mL 12/10/2019 Intramuscular   Manufacturer: [employer], [employer]   Lot: [id number]   NDC: [address]-7

## 2020-02-07 ENCOUNTER — Ambulatory Visit: Payer: Medicare HMO | Attending: Internal Medicine

## 2020-02-07 DIAGNOSIS — Z23 Encounter for immunization: Secondary | ICD-10-CM

## 2020-02-07 NOTE — Progress Notes (Signed)
   Covid-19 Vaccination Clinic  Name:  Dalton Reynolds    MRN: [id number] DOB: 1951/08/13  02/07/2020  Dalton Reynolds was observed post Covid-19 immunization for 15 minutes without incidence. He was provided with Vaccine Information Sheet and instruction to access the V-Safe system.   Dalton Reynolds was instructed to call 911 with any severe reactions post vaccine: Marland Kitchen Difficulty breathing  . Swelling of your face and throat  . A fast heartbeat  . A bad rash all over your body  . Dizziness and weakness    Immunizations Administered    Name Date Dose VIS Date Route   Pfizer COVID-19 Vaccine 02/07/2020  3:49 PM 0.3 mL 12/10/2019 Intramuscular   Manufacturer: [employer], [employer]   Lot: [id number]   NDC: [address]-5

## 2021-12-30 ENCOUNTER — Emergency Department (HOSPITAL_BASED_OUTPATIENT_CLINIC_OR_DEPARTMENT_OTHER)
Admission: EM | Admit: 2021-12-30 | Discharge: 2021-12-30 | Disposition: A | Discharge status: Home / Self Care | Payer: Medicare Other | Attending: Emergency Medicine | Admitting: Emergency Medicine

## 2021-12-30 ENCOUNTER — Encounter (HOSPITAL_BASED_OUTPATIENT_CLINIC_OR_DEPARTMENT_OTHER): Payer: Self-pay | Admitting: *Deleted

## 2021-12-30 ENCOUNTER — Other Ambulatory Visit: Payer: Self-pay

## 2021-12-30 DIAGNOSIS — N419 Inflammatory disease of prostate, unspecified: Secondary | ICD-10-CM

## 2021-12-30 DIAGNOSIS — N410 Acute prostatitis: Secondary | ICD-10-CM | POA: Insufficient documentation

## 2021-12-30 DIAGNOSIS — R350 Frequency of micturition: Secondary | ICD-10-CM | POA: Diagnosis present

## 2021-12-30 DIAGNOSIS — Z7901 Long term (current) use of anticoagulants: Secondary | ICD-10-CM | POA: Diagnosis not present

## 2021-12-30 LAB — CBC WITH DIFFERENTIAL/PLATELET
Abs Immature Granulocytes: 0.02 10*3/uL (ref 0.00–0.07)
Basophils Absolute: 0 10*3/uL (ref 0.0–0.1)
Basophils Relative: 1 %
Eosinophils Absolute: 0.1 10*3/uL (ref 0.0–0.5)
Eosinophils Relative: 2 %
HCT: 43.1 % (ref 39.0–52.0)
Hemoglobin: 14.9 g/dL (ref 13.0–17.0)
Immature Granulocytes: 0 %
Lymphocytes Relative: 30 %
Lymphs Abs: 1.9 10*3/uL (ref 0.7–4.0)
MCH: 29.7 pg (ref 26.0–34.0)
MCHC: 34.6 g/dL (ref 30.0–36.0)
MCV: 85.9 fL (ref 80.0–100.0)
Monocytes Absolute: 0.6 10*3/uL (ref 0.1–1.0)
Monocytes Relative: 9 %
Neutro Abs: 3.9 10*3/uL (ref 1.7–7.7)
Neutrophils Relative %: 58 %
Platelets: 243 10*3/uL (ref 150–400)
RBC: 5.02 MIL/uL (ref 4.22–5.81)
RDW: 13.9 % (ref 11.5–15.5)
WBC: 6.6 10*3/uL (ref 4.0–10.5)
nRBC: 0 % (ref 0.0–0.2)

## 2021-12-30 LAB — URINALYSIS, ROUTINE W REFLEX MICROSCOPIC
Bilirubin Urine: NEGATIVE
Glucose, UA: NEGATIVE mg/dL
Hgb urine dipstick: NEGATIVE
Ketones, ur: NEGATIVE mg/dL
Leukocytes,Ua: NEGATIVE
Nitrite: NEGATIVE
Protein, ur: NEGATIVE mg/dL
Specific Gravity, Urine: >=1.03 (ref 1.005–1.030)
pH: 5.5 (ref 5.0–8.0)

## 2021-12-30 LAB — BASIC METABOLIC PANEL
Anion gap: 10 (ref 5–15)
BUN: 14 mg/dL (ref 8–23)
CO2: 22 mmol/L (ref 22–32)
Calcium: 9.7 mg/dL (ref 8.9–10.3)
Chloride: 107 mmol/L (ref 98–111)
Creatinine, Ser: 0.84 mg/dL (ref 0.61–1.24)
GFR, Estimated: >60 mL/min (ref >60)
Glucose, Bld: 93 mg/dL (ref 70–99)
Potassium: 3.8 mmol/L (ref 3.5–5.1)
Sodium: 139 mmol/L (ref 135–145)

## 2021-12-30 MED ORDER — SULFAMETHOXAZOLE-TRIMETHOPRIM 800-160 MG PO TABS: 1.0000 | ORAL_TABLET | Freq: Two times a day (BID) | ORAL | 0 refills | Status: DC

## 2021-12-30 MED ORDER — SULFAMETHOXAZOLE-TRIMETHOPRIM 800-160 MG PO TABS
1.0000 | ORAL_TABLET | Freq: Once | ORAL | Status: AC
  Administered 2021-12-30: 1 via ORAL
  Filled 2021-12-30: qty 1

## 2021-12-30 MED ORDER — SULFAMETHOXAZOLE-TRIMETHOPRIM 800-160 MG PO TABS: 1.0000 | ORAL_TABLET | Freq: Two times a day (BID) | ORAL | 0 refills | Status: AC

## 2021-12-30 MED ORDER — TAMSULOSIN HCL 0.4 MG PO CAPS: 0.4000 mg | ORAL_CAPSULE | Freq: Every day | ORAL | 0 refills | Status: DC

## 2021-12-30 MED ORDER — TAMSULOSIN HCL 0.4 MG PO CAPS
0.4000 mg | ORAL_CAPSULE | Freq: Once | ORAL | Status: AC
  Administered 2021-12-30: 0.4 mg via ORAL
  Filled 2021-12-30: qty 1

## 2021-12-30 MED ORDER — TAMSULOSIN HCL 0.4 MG PO CAPS: 0.4000 mg | ORAL_CAPSULE | Freq: Every day | ORAL | 0 refills | Status: AC

## 2021-12-30 NOTE — ED Notes (Signed)
Requested for EDP to resend script to [facility]

## 2021-12-30 NOTE — ED Provider Notes (Signed)
[place] EMERGENCY DEPARTMENT Provider Note   CSN: [id number] Arrival date & time: 12/30/21  1058     History  Chief Complaint  Patient presents with   Urinary Retention    Dalton Reynolds is a 71 y.o. male.  He is here with a complaint of urinary frequency and difficulty urinating going on for about a week.  He is having some lower abdominal discomfort.  No back pain.  A little bit of dysuria.  No discharge.  No testicular pain.  No fevers chills nausea vomiting.  No new medications.  No prior history of urinary retention.   The history is provided by the patient.  Male GU Problem Presenting symptoms: dysuria   Presenting symptoms: no scrotal pain   Context: during urination   Relieved by:  Nothing Worsened by:  Urination Ineffective treatments:  None tried Associated symptoms: abdominal pain, urinary frequency and urinary hesitation   Associated symptoms: no fever, no genital itching, no genital lesions, no genital rash, no hematuria, no nausea and no vomiting       Home Medications Prior to Admission medications   Medication Sig Start Date End Date Taking? Authorizing Provider  doxazosin (CARDURA) 1 MG tablet Take 1 mg by mouth daily.  12/01/17   [provider]  fenofibrate 160 MG tablet Take 160 mg by mouth daily. 10/28/17   [provider]  methocarbamol (ROBAXIN) 500 MG tablet Take 1 tablet (500 mg total) by mouth every 6 (six) hours as needed for muscle spasms. 12/17/17   Perkins, Alexzandrew L, PA-C  oxyCODONE (OXY IR/ROXICODONE) 5 MG immediate release tablet Take 1-2 tablets (5-10 mg total) by mouth every 4 (four) hours as needed for moderate pain or severe pain. 12/17/17   Perkins, Alexzandrew L, PA-C  PRESCRIPTION MEDICATION Place 1 application into the nose at bedtime. BYPAP MASK USED AT BEDTIME 05/28/17   [provider]  rivaroxaban (XARELTO) 10 MG TABS tablet Take 1 tablet (10 mg total) by mouth daily with breakfast. Take  Xarelto for two and a half more weeks following discharge from the hospital, then discontinue Xarelto. Once the patient has completed the Xarelto, they may resume the 81 mg Aspirin. 12/18/17   Perkins, Alexzandrew L, PA-C  sulfamethoxazole-trimethoprim (BACTRIM DS,SEPTRA DS) 800-160 MG tablet Take 1 tablet by mouth daily. 11/15/17   [provider]  traMADol (ULTRAM) 50 MG tablet Take 1-2 tablets (50-100 mg total) by mouth every 6 (six) hours as needed for moderate pain (try 5mg  OxyIR first for moderate pain). 12/17/17   Perkins, Alexzandrew L, PA-C      Allergies    Patient has no known allergies.    Review of Systems   Review of Systems  Constitutional:  Negative for fever.  HENT:  Negative for sore throat.   Eyes:  Negative for visual disturbance.  Respiratory:  Negative for shortness of breath.   Cardiovascular:  Negative for chest pain.  Gastrointestinal:  Positive for abdominal pain. Negative for nausea and vomiting.  Genitourinary:  Positive for dysuria, frequency and hesitancy. Negative for hematuria and testicular pain.  Musculoskeletal:  Negative for back pain.  Skin:  Negative for rash.  Neurological:  Negative for headaches.   Physical Exam Updated Vital Signs BP (!) 143/82 (BP Location: Right Arm)    Pulse 88    Temp 98.4 F (36.9 C) (Oral)    Resp 14    Wt 131.5 kg    SpO2 97%    BMI 44.09 kg/m  Physical  Exam Vitals and nursing note reviewed.  Constitutional:      General: He is not in acute distress.    Appearance: Normal appearance. He is well-developed.  HENT:     Head: Normocephalic and atraumatic.  Eyes:     Conjunctiva/sclera: Conjunctivae normal.  Cardiovascular:     Rate and Rhythm: Normal rate and regular rhythm.     Heart sounds: No murmur heard. Pulmonary:     Effort: Pulmonary effort is normal. No respiratory distress.     Breath sounds: Normal breath sounds.  Abdominal:     Palpations: Abdomen is soft.     Tenderness: There is no  abdominal tenderness. There is no guarding or rebound.  Musculoskeletal:        General: No swelling.     Cervical back: Neck supple.  Skin:    General: Skin is warm and dry.     Capillary Refill: Capillary refill takes less than 2 seconds.  Neurological:     General: No focal deficit present.     Mental Status: He is alert.  Psychiatric:        Mood and Affect: Mood normal.    ED Results / Procedures / Treatments   Labs (all labs ordered are listed, but only abnormal results are displayed) Labs Reviewed  URINALYSIS, ROUTINE W REFLEX MICROSCOPIC  BASIC METABOLIC PANEL  CBC WITH DIFFERENTIAL/PLATELET    EKG None  Radiology No results found.  Procedures Procedures    Medications Ordered in ED Medications  sulfamethoxazole-trimethoprim (BACTRIM DS) 800-160 MG per tablet 1 tablet (1 tablet Oral Given 12/30/21 1328)  tamsulosin (FLOMAX) capsule 0.4 mg (0.4 mg Oral Given 12/30/21 1328)    ED Course/ Medical Decision Making/ A&P Clinical Course as of 12/30/21 1738  Sun Dec 30, 2021  1207 Patient was bladder scanned and did not have any significant retention. [MB]  1308 Rectal exam with normal tone and nontender boggy prostate.  I also did a bedside ultrasound confirming there is no evidence of enlarged bladder.  Will cover with antibiotics and Flomax. [MB]    Clinical Course User Index [MB] Hayden Rasmussen, MD                           Medical Decision Making  This patient complains of urinary frequency and hesitancy discomfort; this involves an extensive number of treatment Options and is a complaint that carries with it a high risk of complications and Morbidity. The differential includes retention, UTI, renal colic, prostatitis, epididymitis, torsion  I ordered, reviewed and interpreted labs, which included CBC with normal white count normal hemoglobin, chemistries normal, urinalysis without signs of infection I ordered medication oral antibiotics and Flomax I  ordered imaging studies which included bladder scan and I independently    visualized and interpreted imaging which showed no increased bladder volume Additional history obtained from patient's family member Previous records obtained and reviewed in epic no recent admissions  After the interventions stated above, I reevaluated the patient and found patient to be otherwise nontoxic-appearing.  Reviewed results of work-up with him.  He is comfortable with plan for outpatient follow-up with PCP and urology.  Will cover with Flomax and antibiotics for possible prostatitis.  Return instructions discussed         Final Clinical Impression(s) / ED Diagnoses Final diagnoses:  Urinary frequency  Prostatitis, unspecified prostatitis type    Rx / DC Orders ED Discharge Orders  Ordered    sulfamethoxazole-trimethoprim (BACTRIM DS) 800-160 MG tablet  2 times daily,   Status:  Discontinued        12/30/21 1312    tamsulosin (FLOMAX) 0.4 MG CAPS capsule  Daily,   Status:  Discontinued        12/30/21 1312    sulfamethoxazole-trimethoprim (BACTRIM DS) 800-160 MG tablet  2 times daily        12/30/21 1338    tamsulosin (FLOMAX) 0.4 MG CAPS capsule  Daily        12/30/21 1338              Hayden Rasmussen, MD 12/30/21 1740

## 2021-12-30 NOTE — Discharge Instructions (Signed)
You are seen in the emergency department for urinary frequency and lower abdominal discomfort.  Your urine did not show any signs of infection and your lab work was normal.  We are treating you with antibiotics for possible prostate infection.  We are also treating you with some Flomax which may help your urinary stream.  Please follow-up with your primary care doctor and urology.  Return to the emergency department if any worsening or concerning symptoms.

## 2021-12-30 NOTE — ED Notes (Signed)
Bladder scanned twice by 2 different nursing staff

## 2021-12-30 NOTE — ED Notes (Signed)
Reviewed discharge instructions with patient and family. States understanding

## 2021-12-30 NOTE — ED Triage Notes (Signed)
Pt has been having difficulty urinating for the past week, frequent need to void but output reduced.  Pt has become increasingly uncomfortable and in recent days it has began to hurt to void.  On arrival to triage room pt felt the need to void, sample cup given and showed pt to bathroom. Pt has not previously had a catheter nor has he seen a urologist for this.
